# Patient Record
Sex: Female | Born: 1935 | Race: White | Hispanic: No | Smoking: Never smoker
Health system: Southern US, Community
[De-identification: ages and names within clinical notes are randomized; demographics above are authoritative.]

## PROBLEM LIST (undated history)

## (undated) DIAGNOSIS — I10 Essential (primary) hypertension: Secondary | ICD-10-CM

## (undated) DIAGNOSIS — K5790 Diverticulosis of intestine, part unspecified, without perforation or abscess without bleeding: Secondary | ICD-10-CM

## (undated) HISTORY — PX: TONSILLECTOMY: SUR1361

---

## 1999-07-10 ENCOUNTER — Other Ambulatory Visit: Admission: RE | Admit: 1999-07-10 | Discharge: 1999-07-10 | Payer: Self-pay | Admitting: Family Medicine

## 2000-07-14 ENCOUNTER — Other Ambulatory Visit: Admission: RE | Admit: 2000-07-14 | Discharge: 2000-07-14 | Payer: Self-pay | Admitting: Family Medicine

## 2000-08-26 ENCOUNTER — Encounter: Admission: RE | Admit: 2000-08-26 | Discharge: 2000-08-26 | Payer: Self-pay | Admitting: Family Medicine

## 2000-08-26 ENCOUNTER — Encounter: Payer: Self-pay | Admitting: Family Medicine

## 2001-07-21 ENCOUNTER — Other Ambulatory Visit: Admission: RE | Admit: 2001-07-21 | Discharge: 2001-07-21 | Payer: Self-pay | Admitting: Family Medicine

## 2001-08-27 ENCOUNTER — Encounter: Admission: RE | Admit: 2001-08-27 | Discharge: 2001-08-27 | Payer: Self-pay | Admitting: Family Medicine

## 2001-08-27 ENCOUNTER — Encounter: Payer: Self-pay | Admitting: Family Medicine

## 2002-08-26 ENCOUNTER — Other Ambulatory Visit: Admission: RE | Admit: 2002-08-26 | Discharge: 2002-08-26 | Payer: Self-pay | Admitting: Family Medicine

## 2002-08-30 ENCOUNTER — Encounter: Payer: Self-pay | Admitting: Family Medicine

## 2002-08-30 ENCOUNTER — Encounter: Admission: RE | Admit: 2002-08-30 | Discharge: 2002-08-30 | Payer: Self-pay | Admitting: Family Medicine

## 2003-09-01 ENCOUNTER — Encounter: Admission: RE | Admit: 2003-09-01 | Discharge: 2003-09-01 | Payer: Self-pay | Admitting: Family Medicine

## 2003-09-01 ENCOUNTER — Encounter: Payer: Self-pay | Admitting: Family Medicine

## 2003-09-08 ENCOUNTER — Other Ambulatory Visit: Admission: RE | Admit: 2003-09-08 | Discharge: 2003-09-08 | Payer: Self-pay | Admitting: Family Medicine

## 2004-09-13 ENCOUNTER — Encounter: Admission: RE | Admit: 2004-09-13 | Discharge: 2004-09-13 | Payer: Self-pay | Admitting: Family Medicine

## 2004-09-21 ENCOUNTER — Other Ambulatory Visit: Admission: RE | Admit: 2004-09-21 | Discharge: 2004-09-21 | Payer: Self-pay | Admitting: Family Medicine

## 2005-10-17 ENCOUNTER — Encounter: Admission: RE | Admit: 2005-10-17 | Discharge: 2005-10-17 | Payer: Self-pay | Admitting: Family Medicine

## 2006-10-21 ENCOUNTER — Encounter: Admission: RE | Admit: 2006-10-21 | Discharge: 2006-10-21 | Payer: Self-pay | Admitting: Family Medicine

## 2007-10-27 ENCOUNTER — Encounter: Admission: RE | Admit: 2007-10-27 | Discharge: 2007-10-27 | Payer: Self-pay | Admitting: Family Medicine

## 2007-11-03 ENCOUNTER — Other Ambulatory Visit: Admission: RE | Admit: 2007-11-03 | Discharge: 2007-11-03 | Payer: Self-pay | Admitting: Family Medicine

## 2008-10-28 ENCOUNTER — Encounter: Admission: RE | Admit: 2008-10-28 | Discharge: 2008-10-28 | Payer: Self-pay | Admitting: Family Medicine

## 2008-11-15 ENCOUNTER — Ambulatory Visit (HOSPITAL_COMMUNITY): Admission: RE | Admit: 2008-11-15 | Discharge: 2008-11-15 | Payer: Self-pay | Admitting: Family Medicine

## 2008-11-15 ENCOUNTER — Ambulatory Visit: Payer: Self-pay | Admitting: Vascular Surgery

## 2008-11-15 ENCOUNTER — Encounter (INDEPENDENT_AMBULATORY_CARE_PROVIDER_SITE_OTHER): Payer: Self-pay | Admitting: Family Medicine

## 2009-10-30 ENCOUNTER — Encounter: Admission: RE | Admit: 2009-10-30 | Discharge: 2009-10-30 | Payer: Self-pay | Admitting: Family Medicine

## 2010-11-06 ENCOUNTER — Encounter: Admission: RE | Admit: 2010-11-06 | Discharge: 2010-11-06 | Payer: Self-pay | Admitting: Family Medicine

## 2010-11-20 ENCOUNTER — Other Ambulatory Visit
Admission: RE | Admit: 2010-11-20 | Discharge: 2010-11-20 | Payer: Self-pay | Source: Home / Self Care | Admitting: Family Medicine

## 2011-10-13 ENCOUNTER — Emergency Department (HOSPITAL_COMMUNITY): Payer: BC Managed Care – PPO

## 2011-10-13 ENCOUNTER — Inpatient Hospital Stay (HOSPITAL_COMMUNITY)
Admission: EM | Admit: 2011-10-13 | Discharge: 2011-10-16 | DRG: 182 | Disposition: A | Discharge status: Home / Self Care | Payer: BC Managed Care – PPO | Attending: Internal Medicine | Admitting: Internal Medicine

## 2011-10-13 ENCOUNTER — Encounter: Payer: Self-pay | Admitting: Emergency Medicine

## 2011-10-13 DIAGNOSIS — I10 Essential (primary) hypertension: Secondary | ICD-10-CM | POA: Diagnosis present

## 2011-10-13 DIAGNOSIS — R197 Diarrhea, unspecified: Secondary | ICD-10-CM

## 2011-10-13 DIAGNOSIS — K621 Rectal polyp: Secondary | ICD-10-CM | POA: Clinically undetermined

## 2011-10-13 DIAGNOSIS — I959 Hypotension, unspecified: Secondary | ICD-10-CM | POA: Diagnosis present

## 2011-10-13 DIAGNOSIS — K625 Hemorrhage of anus and rectum: Secondary | ICD-10-CM

## 2011-10-13 DIAGNOSIS — D128 Benign neoplasm of rectum: Secondary | ICD-10-CM | POA: Diagnosis present

## 2011-10-13 DIAGNOSIS — N9489 Other specified conditions associated with female genital organs and menstrual cycle: Secondary | ICD-10-CM | POA: Diagnosis present

## 2011-10-13 DIAGNOSIS — E876 Hypokalemia: Secondary | ICD-10-CM | POA: Diagnosis present

## 2011-10-13 DIAGNOSIS — A0472 Enterocolitis due to Clostridium difficile, not specified as recurrent: Secondary | ICD-10-CM

## 2011-10-13 DIAGNOSIS — R1031 Right lower quadrant pain: Secondary | ICD-10-CM | POA: Diagnosis present

## 2011-10-13 DIAGNOSIS — K55039 Acute (reversible) ischemia of large intestine, extent unspecified: Secondary | ICD-10-CM | POA: Diagnosis present

## 2011-10-13 DIAGNOSIS — D72829 Elevated white blood cell count, unspecified: Secondary | ICD-10-CM | POA: Diagnosis present

## 2011-10-13 DIAGNOSIS — D129 Benign neoplasm of anus and anal canal: Secondary | ICD-10-CM | POA: Diagnosis present

## 2011-10-13 DIAGNOSIS — K5730 Diverticulosis of large intestine without perforation or abscess without bleeding: Secondary | ICD-10-CM | POA: Diagnosis present

## 2011-10-13 HISTORY — DX: Diverticulosis of intestine, part unspecified, without perforation or abscess without bleeding: K57.90

## 2011-10-13 HISTORY — DX: Essential (primary) hypertension: I10

## 2011-10-13 LAB — PROTIME-INR
INR: 0.96 (ref 0.00–1.49)
Prothrombin Time: 13 seconds (ref 11.6–15.2)

## 2011-10-13 LAB — BASIC METABOLIC PANEL
BUN: 17 mg/dL (ref 6–23)
CO2: 25 mEq/L (ref 19–32)
Chloride: 99 mEq/L (ref 96–112)
Creatinine, Ser: 0.72 mg/dL (ref 0.50–1.10)

## 2011-10-13 LAB — CBC
HCT: 39.5 % (ref 36.0–46.0)
Hemoglobin: 14.1 g/dL (ref 12.0–15.0)
MCHC: 35.7 g/dL (ref 30.0–36.0)
WBC: 18.8 10*3/uL — ABNORMAL HIGH (ref 4.0–10.5)

## 2011-10-13 LAB — OCCULT BLOOD, POC DEVICE: Fecal Occult Bld: POSITIVE

## 2011-10-13 LAB — DIFFERENTIAL
Lymphocytes Relative: 5 % — ABNORMAL LOW (ref 12–46)
Monocytes Absolute: 1.2 10*3/uL — ABNORMAL HIGH (ref 0.1–1.0)
Monocytes Relative: 7 % (ref 3–12)
Neutro Abs: 16.6 10*3/uL — ABNORMAL HIGH (ref 1.7–7.7)

## 2011-10-13 MED ORDER — PANTOPRAZOLE SODIUM 40 MG IV SOLR
INTRAVENOUS | Status: AC
  Filled 2011-10-13: qty 40

## 2011-10-13 MED ORDER — MORPHINE SULFATE 4 MG/ML IJ SOLN
4.0000 mg | Freq: Once | INTRAMUSCULAR | Status: AC
  Administered 2011-10-13: 2 mg via INTRAVENOUS

## 2011-10-13 MED ORDER — IOHEXOL 300 MG/ML  SOLN
100.0000 mL | Freq: Once | INTRAMUSCULAR | Status: AC | PRN
  Administered 2011-10-13: 100 mL via INTRAVENOUS

## 2011-10-13 MED ORDER — MORPHINE SULFATE 4 MG/ML IJ SOLN
INTRAMUSCULAR | Status: AC
  Filled 2011-10-13: qty 1

## 2011-10-13 MED ORDER — PANTOPRAZOLE SODIUM 40 MG IV SOLR
40.0000 mg | Freq: Once | INTRAVENOUS | Status: AC
  Administered 2011-10-13: 40 mg via INTRAVENOUS

## 2011-10-13 NOTE — ED Notes (Signed)
Family at bedside. 

## 2011-10-13 NOTE — ED Notes (Signed)
ZOX:[id number]<AV> Expected date:10/13/11<BR> Expected time: 4:19 PM<BR> Means of arrival:Ambulance<BR> Comments:<BR> [facility] transporting 76 yoF Lightheaded, cramps.  Initially hypotensive.

## 2011-10-13 NOTE — ED Notes (Signed)
Patient denies pain and is resting comfortably. Updated on care

## 2011-10-14 ENCOUNTER — Encounter (HOSPITAL_COMMUNITY): Payer: Self-pay | Admitting: Internal Medicine

## 2011-10-14 LAB — CBC
HCT: 36.8 % (ref 36.0–46.0)
MCH: 32.2 pg (ref 26.0–34.0)
MCHC: 35.6 g/dL (ref 30.0–36.0)
MCV: 90.4 fL (ref 78.0–100.0)
RDW: 11.9 % (ref 11.5–15.5)

## 2011-10-14 MED ORDER — ACETAMINOPHEN 325 MG PO TABS
650.0000 mg | ORAL_TABLET | Freq: Four times a day (QID) | ORAL | Status: DC | PRN
  Filled 2011-10-14 (×2): qty 2

## 2011-10-14 MED ORDER — ZOLPIDEM TARTRATE 5 MG PO TABS
5.0000 mg | ORAL_TABLET | Freq: Every evening | ORAL | Status: DC | PRN
  Administered 2011-10-14 – 2011-10-15 (×2): 5 mg via ORAL
  Filled 2011-10-14 (×2): qty 1

## 2011-10-14 MED ORDER — AMLODIPINE BESYLATE 10 MG PO TABS
10.0000 mg | ORAL_TABLET | Freq: Every day | ORAL | Status: DC
  Administered 2011-10-14 – 2011-10-16 (×3): 10 mg via ORAL
  Filled 2011-10-14 (×3): qty 1

## 2011-10-14 MED ORDER — ONDANSETRON HCL 4 MG PO TABS
4.0000 mg | ORAL_TABLET | Freq: Four times a day (QID) | ORAL | Status: DC | PRN
  Filled 2011-10-14: qty 1

## 2011-10-14 MED ORDER — METRONIDAZOLE IN NACL 5-0.79 MG/ML-% IV SOLN
500.0000 mg | Freq: Three times a day (TID) | INTRAVENOUS | Status: DC
  Administered 2011-10-14: 500 mg via INTRAVENOUS
  Filled 2011-10-14 (×4): qty 100

## 2011-10-14 MED ORDER — HYDROMORPHONE HCL PF 1 MG/ML IJ SOLN: 0.5000 mg | INTRAMUSCULAR | Status: DC | PRN

## 2011-10-14 MED ORDER — OXYCODONE HCL 5 MG PO TABS
5.0000 mg | ORAL_TABLET | ORAL | Status: DC | PRN
  Filled 2011-10-14: qty 1

## 2011-10-14 MED ORDER — METRONIDAZOLE 500 MG PO TABS
500.0000 mg | ORAL_TABLET | Freq: Three times a day (TID) | ORAL | Status: DC
  Administered 2011-10-14: 500 mg via ORAL
  Filled 2011-10-14 (×3): qty 1

## 2011-10-14 MED ORDER — CIPROFLOXACIN IN D5W 400 MG/200ML IV SOLN
400.0000 mg | Freq: Two times a day (BID) | INTRAVENOUS | Status: DC
  Administered 2011-10-14: 400 mg via INTRAVENOUS
  Filled 2011-10-14 (×3): qty 200

## 2011-10-14 MED ORDER — SODIUM CHLORIDE 0.9 % IV SOLN
INTRAVENOUS | Status: DC
  Administered 2011-10-14 – 2011-10-15 (×2): via INTRAVENOUS

## 2011-10-14 MED ORDER — LISINOPRIL-HYDROCHLOROTHIAZIDE 20-12.5 MG PO TABS: 2.0000 | ORAL_TABLET | Freq: Every day | ORAL | Status: DC

## 2011-10-14 MED ORDER — SENNA 8.6 MG PO TABS
2.0000 | ORAL_TABLET | Freq: Every day | ORAL | Status: DC | PRN
  Filled 2011-10-14: qty 2

## 2011-10-14 MED ORDER — LISINOPRIL 40 MG PO TABS
40.0000 mg | ORAL_TABLET | Freq: Every day | ORAL | Status: DC
  Administered 2011-10-14 – 2011-10-16 (×3): 40 mg via ORAL
  Filled 2011-10-14 (×3): qty 1

## 2011-10-14 MED ORDER — METRONIDAZOLE 500 MG PO TABS
500.0000 mg | ORAL_TABLET | Freq: Three times a day (TID) | ORAL | Status: DC
  Administered 2011-10-14 – 2011-10-16 (×5): 500 mg via ORAL
  Filled 2011-10-14 (×9): qty 1

## 2011-10-14 MED ORDER — HYDROCHLOROTHIAZIDE 25 MG PO TABS
25.0000 mg | ORAL_TABLET | Freq: Every day | ORAL | Status: DC
  Administered 2011-10-14 – 2011-10-15 (×2): 25 mg via ORAL
  Filled 2011-10-14 (×3): qty 1

## 2011-10-14 MED ORDER — ALUM & MAG HYDROXIDE-SIMETH 200-200-20 MG/5ML PO SUSP
30.0000 mL | Freq: Four times a day (QID) | ORAL | Status: DC | PRN
  Filled 2011-10-14: qty 30

## 2011-10-14 MED ORDER — ACETAMINOPHEN 650 MG RE SUPP
650.0000 mg | Freq: Four times a day (QID) | RECTAL | Status: DC | PRN
  Filled 2011-10-14: qty 1

## 2011-10-14 MED ORDER — ONDANSETRON HCL 4 MG/2ML IJ SOLN: 4.0000 mg | Freq: Three times a day (TID) | INTRAMUSCULAR | Status: AC | PRN

## 2011-10-14 MED ORDER — SODIUM CHLORIDE 0.9 % IV SOLN: INTRAVENOUS | Status: DC

## 2011-10-14 MED ORDER — SACCHAROMYCES BOULARDII 250 MG PO CAPS
250.0000 mg | ORAL_CAPSULE | Freq: Two times a day (BID) | ORAL | Status: DC
  Administered 2011-10-14 – 2011-10-16 (×5): 250 mg via ORAL
  Filled 2011-10-14 (×6): qty 1

## 2011-10-14 NOTE — H&P (Signed)
Admission Date:  10/14/2011  PCP:   Dr. Kathrene Bongo  Chief Complaint:    HPI: Joan Cook is an 75 y.o. female who presents with sudden onset of RLQ ABD Pain followed by rectal bleeding with bright red blood in the afternoon while she was playing tennis.  She states she felt dizzy and almost fainted, EMS was called and her blood pressure was found to be 70's systolic, IV Fluids were started in the field and her blood pressure improved.  Her friends on the tennis court gave her 2 baby aspirin prior to EMS arriving.  She denied having any chest pain, nausea or vomiting or fevers or chills.   She had a similar episode a few years ago.    Past Medical History  Diagnosis Date  . Hypertension   . Diverticulosis     Past Surgical History  Procedure Date  . Tonsillectomy     Medications:  HOME MEDS: Prior to Admission medications   Medication Sig Start Date End Date Taking? Authorizing Provider  amLODipine (NORVASC) 10 MG tablet Take 10 mg by mouth daily.    Yes Historical Provider, MD  lisinopril-hydrochlorothiazide (PRINZIDE,ZESTORETIC) 20-12.5 MG per tablet Take 2 tablets by mouth daily.    Yes Historical Provider, MD    Allergies:  No Known Allergies  Social History:   reports that she has never smoked. She does not have any smokeless tobacco history on file. She reports that she drinks alcohol. She reports that she does not use illicit drugs.  Family History: Family History  Problem Relation Age of Onset  . Hypertension Mother   . Hypertension Father     Rewiew of Systems:  The patient denies anorexia, fever, weight loss,, vision loss, decreased hearing, hoarseness, chest pain, syncope, dyspnea on exertion, peripheral edema, balance deficits, hemoptysis,severe indigestion/heartburn, hematuria, incontinence, genital sores, muscle weakness, suspicious skin lesions, transient blindness, difficulty walking, depression, unusual weight change, enlarged lymph nodes, angioedema, and  breast masses.  Physical Exam: Filed Vitals:   10/13/11 1630 10/13/11 1800 10/13/11 2017 10/14/11 0132  BP: 150/53 124/50 122/53 133/62  Pulse: 87  65 78  Temp: 97.7 F (36.5 C)   98.5 F (36.9 C)  TempSrc: Oral  Other (Comment) Oral  Resp: 18   18  SpO2: 99%  99% 100%   Blood pressure 133/62, pulse 78, temperature 98.5 F (36.9 C), temperature source Oral, resp. rate 18, SpO2 100.00%.  GEN:  Pleasant 75 year old thin well developed female lying in the stretcher in no acute distress; cooperative with exam PSYCH: She is alert and oriented x4; does not appear anxious does not appear depressed; affect is normal HEENT: Normocephalic and Atraumatic, Mucous membranes pink and anicteric; PERRLA; EOM intact; Nares: Patent, Oropharynx: Clear, Edentulous or Fair Dentition, Neck:  FROM, no cervical lymphadenopathy nor thyromegaly or carotid bruit; no JVD; Breasts:: Not examined CHEST WALL: No tenderness CHEST: Normal respiration, clear to auscultation bilaterally HEART: Regular rate and rhythm; no murmurs rubs or gallops BACK: No kyphosis or scoliosis; no CVA tenderness ABDOMEN: Obese, soft non-tender; no masses, no organomegaly, normal abdominal bowel sounds; no pannus; no intertriginous candida. Rectal Exam: Done by EDP FOBT: Bright Red Blood, Strongly Heme+++  EXTREMITIES: No bone or joint deformity; age-appropriate arthropathy of the hands and knees; no edema; no ulcerations. Genitalia: not examined PULSES: 2+ and symmetric SKIN: Normal hydration no rash or ulceration CNS: Cranial nerves 2-12 grossly intact no focal neurologic deficit   Labs & Imaging Results for orders placed during  the hospital encounter of 10/13/11 (from the past 48 hour(s))  CBC     Status: Abnormal   Collection Time   10/13/11  6:00 PM      Component Value Range Comment   WBC 18.8 (*) 4.0 - 10.5 (K/uL)    RBC 4.37  3.87 - 5.11 (MIL/uL)    Hemoglobin 14.1  12.0 - 15.0 (g/dL)    HCT 04.5  40.9 - 81.1 (%)    MCV  90.4  78.0 - 100.0 (fL)    MCH 32.3  26.0 - 34.0 (pg)    MCHC 35.7  30.0 - 36.0 (g/dL)    RDW 91.4  78.2 - 95.6 (%)    Platelets 278  150 - 400 (K/uL)   DIFFERENTIAL     Status: Abnormal   Collection Time   10/13/11  6:00 PM      Component Value Range Comment   Neutrophils Relative 88 (*) 43 - 77 (%)    Neutro Abs 16.6 (*) 1.7 - 7.7 (K/uL)    Lymphocytes Relative 5 (*) 12 - 46 (%)    Lymphs Abs 0.9  0.7 - 4.0 (K/uL)    Monocytes Relative 7  3 - 12 (%)    Monocytes Absolute 1.2 (*) 0.1 - 1.0 (K/uL)    Eosinophils Relative 0  0 - 5 (%)    Eosinophils Absolute 0.0  0.0 - 0.7 (K/uL)    Basophils Relative 0  0 - 1 (%)    Basophils Absolute 0.0  0.0 - 0.1 (K/uL)   BASIC METABOLIC PANEL     Status: Abnormal   Collection Time   10/13/11  6:00 PM      Component Value Range Comment   Sodium 135  135 - 145 (mEq/L)    Potassium 4.0  3.5 - 5.1 (mEq/L)    Chloride 99  96 - 112 (mEq/L)    CO2 25  19 - 32 (mEq/L)    Glucose, Bld 176 (*) 70 - 99 (mg/dL)    BUN 17  6 - 23 (mg/dL)    Creatinine, Ser 2.13  0.50 - 1.10 (mg/dL)    Calcium 9.7  8.4 - 10.5 (mg/dL)    GFR calc non Af Amer 83 (*) >90 (mL/min)    GFR calc Af Amer >90  >90 (mL/min)   PROTIME-INR     Status: Normal   Collection Time   10/13/11  6:00 PM      Component Value Range Comment   Prothrombin Time 13.0  11.6 - 15.2 (seconds)    INR 0.96  0.00 - 1.49    APTT     Status: Normal   Collection Time   10/13/11  6:00 PM      Component Value Range Comment   aPTT 27  24 - 37 (seconds)   OCCULT BLOOD, POC DEVICE     Status: Normal   Collection Time   10/13/11  6:14 PM      Component Value Range Comment   Fecal Occult Bld POSITIVE     TYPE AND SCREEN     Status: Normal   Collection Time   10/13/11  6:41 PM      Component Value Range Comment   ABO/RH(D) A POS      Antibody Screen NEG      Sample Expiration 10/16/2011     ABO/RH     Status: Normal   Collection Time   10/13/11  7:22 PM      Component  Value Range Comment   ABO/RH(D)  A POS      Ct Abdomen Pelvis W Contrast  10/13/2011  *RADIOLOGY REPORT*  Clinical Data: Diarrhea, abdominal pain.  CT ABDOMEN AND PELVIS WITH CONTRAST  Technique:  Multidetector CT imaging of the abdomen and pelvis was performed following the standard protocol during bolus administration of intravenous contrast.  Contrast: OMNIPAQUE IOHEXOL 300 MG/ML IV SOLN  Comparison:  None  Findings: Limited images through the lung bases demonstrate no significant appreciable abnormality. The heart size is within normal limits. No pleural or pericardial effusion.  Several hepatic hypodensities are nonspecific however nonaggressive appearing.  No biliary ductal dilatation.  Unremarkable spleen, pancreas, adrenal glands.  There are a couple tiny hypodensities within the lower poles of the kidneys. No hydronephrosis.  No hydroureter.  Colonic diverticulosis.  No overt evidence for diverticulitis.  No bowel obstruction.  Normal appendix.  No free intraperitoneal air or fluid.  No lymphadenopathy.  There is scattered atherosclerotic calcification of the aorta and its branches. No aneurysmal dilatation.  Thin-walled bladder.  Unremarkable uterus.  Right adnexal lesion with punctate calcifications may reflect an exophytic fibroid or ovarian lesion in can be followed up with a non emergent ultrasound.  No acute osseous abnormality.  L4-5 DDD.  IMPRESSION: Colonic diverticulosis without overt evidence for diverticulitis.  2.7 cm right adnexal lesion with associated punctate calcifications may represent an exophytic fibroid or ovarian process.  Consider non emergent ultrasound follow-up.  Original Report Authenticated By: Waneta Martins, M.D.      Assessment: 1.  Rectal Bleeding 2.  Diverticulosis with Diverticular Bleeding versus Possible Early Diverticitis 3.  Hypertension 4.  Leukocytosis  Plan:  Admitted, and H/Hs will be performed q 8 hours X 48 hours, a type and Screen has been ordered in the event Blood  needs to be transfused.  Patient will be placed on IV Cipro and Flagyl Antibiotic therapy, Stool Studies have been ordered for Culture and Sensitivity and C.Diff, however this is felt to be due to a probable Diverticular bleed.  GI will be consulted for further evaluation. And the patient will be placed on clear liquids and IVFs have been ordered for maintenance therapy.  SCDs have been ordered for DVT prophylaxis.  The patient is a Full Code at this time.   Other plans as per orders.   Courtany Mcmurphy C 10/14/2011, 3:07 AM

## 2011-10-14 NOTE — Consult Note (Signed)
[facility] Consult Note  Referring Provider: No ref. provider found Primary Care Physician:  No primary provider on file. Primary Gastroenterologist:  Dr.  Antony Contras Complaint: Abdominal cramps and bloody diarrhea HPI: Joan Cook is an 75 y.o. white female who presents sudden onset yesterday while playing tennis of generalized abdominal cramps, malaise sweatiness and incontinence of diarrhea. The diarrhea continued and became voluminous in accompanied by increasing amounts of blood. She was taken to the emergency room. Her initial blood pressure on the scene was 90/40. She was afebrile but was found to have a white blood cell count of 18,800. She underwent an abdominal CT scan which revealed left-sided diverticulosis but no other definite abnormalities. She underwent a colonoscopy in 2010 which showed some small adenomatous polyps and left-sided diverticulosis. Her abdominal cramps are moderately improved and her diarrhea has slowed. She was started on Cipro and Flagyl and her white blood cell count today is 13,000.  She denies any recent antibiotics although she has changed to close of her husband who lives in a nursing home and the last week. However no 1 at the nursing home has been reportedly ill. She denies any recent travel.  Past Medical History  Diagnosis Date  . Hypertension   . Diverticulosis     Past Surgical History  Procedure Date  . Tonsillectomy     Medications Prior to Admission  Medication Dose Route Frequency Provider Last Rate Last Dose  . 0.9 %  sodium chloride infusion   Intravenous Continuous Ron Parker, MD      . acetaminophen (TYLENOL) tablet 650 mg  650 mg Oral Q6H PRN Ron Parker, MD       Or  . acetaminophen (TYLENOL) suppository 650 mg  650 mg Rectal Q6H PRN Ron Parker, MD      . alum & mag hydroxide-simeth (MAALOX/MYLANTA) 200-200-20 MG/5ML suspension 30 mL  30 mL Oral Q6H PRN Ron Parker, MD      . amLODipine  (NORVASC) tablet 10 mg  10 mg Oral Daily Ron Parker, MD   10 mg at 10/14/11 0945  . ciprofloxacin (CIPRO) IVPB 400 mg  400 mg Intravenous Q12H Ron Parker, MD   400 mg at 10/14/11 0558  . lisinopril (PRINIVIL,ZESTRIL) tablet 40 mg  40 mg Oral Daily Elvira Chika Madueme, PHARMD   40 mg at 10/14/11 0946   And  . hydrochlorothiazide (HYDRODIURIL) tablet 25 mg  25 mg Oral Daily Elvira Chika Madueme, PHARMD   25 mg at 10/14/11 0946  . HYDROmorphone (DILAUDID) injection 0.5-1 mg  0.5-1 mg Intravenous Q3H PRN Ron Parker, MD      . iohexol (OMNIPAQUE) 300 MG/ML injection 100 mL  100 mL Intravenous Once PRN Medication Radiologist   100 mL at 10/13/11 2310  . metroNIDAZOLE (FLAGYL) IVPB 500 mg  500 mg Intravenous Q8H Ron Parker, MD   500 mg at 10/14/11 9604  . metroNIDAZOLE (FLAGYL) tablet 500 mg  500 mg Oral Q8H Adeline C Viyuoh   500 mg at 10/14/11 1400  . morphine 4 MG/ML injection 4 mg  4 mg Intravenous Once Nat Christen, MD   2 mg at 10/13/11 1850  . ondansetron (ZOFRAN) injection 4 mg  4 mg Intravenous Q8H PRN Nat Christen, MD      . ondansetron [facility]) tablet 4 mg  4 mg Oral Q6H PRN Ron Parker, MD      . oxyCODONE (Oxy IR/ROXICODONE) immediate release tablet  5 mg  5 mg Oral Q4H PRN Ron Parker, MD      . pantoprazole (PROTONIX) injection 40 mg  40 mg Intravenous Once Nat Christen, MD   40 mg at 10/13/11 1850  . saccharomyces boulardii (FLORASTOR) capsule 250 mg  250 mg Oral BID Adeline C Viyuoh   250 mg at 10/14/11 1400  . senna (SENOKOT) tablet 17.2 mg  2 tablet Oral Daily PRN Ron Parker, MD      . zolpidem (AMBIEN) tablet 5 mg  5 mg Oral QHS PRN Ron Parker, MD      . DISCONTD: 0.9 %  sodium chloride infusion   Intravenous STAT Nat Christen, MD      . DISCONTD: lisinopril-hydrochlorothiazide (PRINZIDE,ZESTORETIC) 20-12.5 MG per tablet 2 tablet  2 tablet Oral Daily Ron Parker, MD       No current outpatient  prescriptions on file as of 10/14/2011.    Allergies: No Known Allergies  Family History  Problem Relation Age of Onset  . Hypertension Mother   . Hypertension Father     Social History:  reports that she has never smoked. She does not have any smokeless tobacco history on file. She reports that she drinks alcohol. She reports that she does not use illicit drugs.    Blood pressure 151/70, pulse 77, temperature 98.4 F (36.9 C), temperature source Oral, resp. rate 16, weight 70.2 kg (154 lb 12.2 oz), SpO2 98.00%. Head: Normocephalic, without obvious abnormality, atraumatic Neck: no adenopathy, no carotid bruit, no JVD, supple, symmetrical, trachea midline and thyroid not enlarged, symmetric, no tenderness/mass/nodules Resp: clear to auscultation bilaterally Cardio: regular rate and rhythm, S1, S2 normal, no murmur, click, rub or gallop GI:  abdomen soft, nondistended with normoactive bowel sounds. No hepatosplenomegaly, mass or guarding.  Extremities: extremities normal, atraumatic, no cyanosis or edema  Results for orders placed during the hospital encounter of 10/13/11 (from the past 48 hour(s))  CBC     Status: Abnormal   Collection Time   10/13/11  6:00 PM      Component Value Range Comment   WBC 18.8 (*) 4.0 - 10.5 (K/uL)    RBC 4.37  3.87 - 5.11 (MIL/uL)    Hemoglobin 14.1  12.0 - 15.0 (g/dL)    HCT 16.1  09.6 - 04.5 (%)    MCV 90.4  78.0 - 100.0 (fL)    MCH 32.3  26.0 - 34.0 (pg)    MCHC 35.7  30.0 - 36.0 (g/dL)    RDW 40.9  81.1 - 91.4 (%)    Platelets 278  150 - 400 (K/uL)   DIFFERENTIAL     Status: Abnormal   Collection Time   10/13/11  6:00 PM      Component Value Range Comment   Neutrophils Relative 88 (*) 43 - 77 (%)    Neutro Abs 16.6 (*) 1.7 - 7.7 (K/uL)    Lymphocytes Relative 5 (*) 12 - 46 (%)    Lymphs Abs 0.9  0.7 - 4.0 (K/uL)    Monocytes Relative 7  3 - 12 (%)    Monocytes Absolute 1.2 (*) 0.1 - 1.0 (K/uL)    Eosinophils Relative 0  0 - 5 (%)     Eosinophils Absolute 0.0  0.0 - 0.7 (K/uL)    Basophils Relative 0  0 - 1 (%)    Basophils Absolute 0.0  0.0 - 0.1 (K/uL)   BASIC METABOLIC PANEL     Status: Abnormal  Collection Time   10/13/11  6:00 PM      Component Value Range Comment   Sodium 135  135 - 145 (mEq/L)    Potassium 4.0  3.5 - 5.1 (mEq/L)    Chloride 99  96 - 112 (mEq/L)    CO2 25  19 - 32 (mEq/L)    Glucose, Bld 176 (*) 70 - 99 (mg/dL)    BUN 17  6 - 23 (mg/dL)    Creatinine, Ser 4.09  0.50 - 1.10 (mg/dL)    Calcium 9.7  8.4 - 10.5 (mg/dL)    GFR calc non Af Amer 83 (*) >90 (mL/min)    GFR calc Af Amer >90  >90 (mL/min)   PROTIME-INR     Status: Normal   Collection Time   10/13/11  6:00 PM      Component Value Range Comment   Prothrombin Time 13.0  11.6 - 15.2 (seconds)    INR 0.96  0.00 - 1.49    APTT     Status: Normal   Collection Time   10/13/11  6:00 PM      Component Value Range Comment   aPTT 27  24 - 37 (seconds)   OCCULT BLOOD, POC DEVICE     Status: Normal   Collection Time   10/13/11  6:14 PM      Component Value Range Comment   Fecal Occult Bld POSITIVE     STOOL CULTURE     Status: Normal (Preliminary result)   Collection Time   10/13/11  6:20 PM      Component Value Range Comment   Specimen Description STOOL      Special Requests NONE      Culture Culture reincubated for better growth      Report Status PENDING     TYPE AND SCREEN     Status: Normal   Collection Time   10/13/11  6:41 PM      Component Value Range Comment   ABO/RH(D) A POS      Antibody Screen NEG      Sample Expiration 10/16/2011     ABO/RH     Status: Normal   Collection Time   10/13/11  7:22 PM      Component Value Range Comment   ABO/RH(D) A POS     CBC     Status: Abnormal   Collection Time   10/14/11  4:38 AM      Component Value Range Comment   WBC 13.6 (*) 4.0 - 10.5 (K/uL)    RBC 4.07  3.87 - 5.11 (MIL/uL)    Hemoglobin 13.1  12.0 - 15.0 (g/dL)    HCT 81.1  91.4 - 78.2 (%)    MCV 90.4  78.0 - 100.0 (fL)     MCH 32.2  26.0 - 34.0 (pg)    MCHC 35.6  30.0 - 36.0 (g/dL)    RDW 95.6  21.3 - 08.6 (%)    Platelets 244  150 - 400 (K/uL)   CLOSTRIDIUM DIFFICILE BY PCR     Status: Abnormal   Collection Time   10/14/11  9:58 AM      Component Value Range Comment   C difficile by pcr POSITIVE (*) NEGATIVE     Ct Abdomen Pelvis W Contrast  10/13/2011  *RADIOLOGY REPORT*  Clinical Data: Diarrhea, abdominal pain.  CT ABDOMEN AND PELVIS WITH CONTRAST  Technique:  Multidetector CT imaging of the abdomen and pelvis was performed following the standard protocol  during bolus administration of intravenous contrast.  Contrast: OMNIPAQUE IOHEXOL 300 MG/ML IV SOLN  Comparison:  None  Findings: Limited images through the lung bases demonstrate no significant appreciable abnormality. The heart size is within normal limits. No pleural or pericardial effusion.  Several hepatic hypodensities are nonspecific however nonaggressive appearing.  No biliary ductal dilatation.  Unremarkable spleen, pancreas, adrenal glands.  There are a couple tiny hypodensities within the lower poles of the kidneys. No hydronephrosis.  No hydroureter.  Colonic diverticulosis.  No overt evidence for diverticulitis.  No bowel obstruction.  Normal appendix.  No free intraperitoneal air or fluid.  No lymphadenopathy.  There is scattered atherosclerotic calcification of the aorta and its branches. No aneurysmal dilatation.  Thin-walled bladder.  Unremarkable uterus.  Right adnexal lesion with punctate calcifications may reflect an exophytic fibroid or ovarian lesion in can be followed up with a non emergent ultrasound.  No acute osseous abnormality.  L4-5 DDD.  IMPRESSION: Colonic diverticulosis without overt evidence for diverticulitis.  2.7 cm right adnexal lesion with associated punctate calcifications may represent an exophytic fibroid or ovarian process.  Consider non emergent ultrasound follow-up.  Original Report Authenticated By: Waneta Martins,  M.D.    Assessment: Acute bloody diarrhea with positive C. difficile toxin somewhat atypical presentation for pseudomembranous colitis. Plan:  Any Flagyl and would continue Cipro until any pending stool studies have returned. Would consider colonoscopy or sigmoidoscopy if does not improve to baseline promptly. Burwell Bethel C 10/14/2011, 3:39 PM

## 2011-10-14 NOTE — Progress Notes (Signed)
Pt came in from ER via stretcher,alert and oriented x3, vital signs b/p 150/53, p 87, T 98.5,. Pt had an episode of bright red  Watery stool upon arrival to the floor, she reports she has been having bloody stool, since the evening of the 10/13/11. Pt denies pain at this time, will continue to monitor,Kenzo Ozment Amy 10/14/11.

## 2011-10-14 NOTE — Progress Notes (Signed)
I have seen and examined pt , she states she has continued  To have bloody diarrhea overnight. She is followed by Dr Bosie Clos. Will obtain stool for c.diff, continue abx and consult GI for further recs. Will continue current management plan as per  Dr  Lovell Sheehan, Follow pending studies and further treat as appropriate.

## 2011-10-14 NOTE — ED Provider Notes (Addendum)
History     CSN: [id number] Arrival date & time: 10/13/2011  4:24 PM   First MD Initiated Contact with Patient 10/13/11 1753      Chief Complaint  Patient presents with  . Abdominal Pain    followed by diaphoresis and stool incontinence  . Dizziness    (Consider location/radiation/quality/duration/timing/severity/associated sxs/prior treatment) HPI Comments: Patient presented with some diarrhea that began this afternoon.  She also noted some vague generalized abdominal pain.  She's had no associated nausea, vomiting, fevers.  She has noted that his she's continued to have frequent stools they do appear to be reddish in color it and possibly bloody.  She has not had any past GI bleeds.  No prior abdominal surgeries.  She has not tried taking any medicines to help with her symptoms.  She has not noted anything that specifically makes her symptoms worse.  She's not been any recent antibiotic therapy.  She's not had any episodes similar to this.  Patient did have some lightheadedness and diaphoresis with this earlier and a presyncopal event that was associated with her diarrhea.  Patient is a 75 y.o. female presenting with abdominal pain. The history is provided by the patient. No language interpreter was used.  Abdominal Pain The primary symptoms of the illness include abdominal pain and diarrhea. The primary symptoms of the illness do not include fever, shortness of breath, nausea, vomiting, dysuria or vaginal discharge. The current episode started 3 to 5 hours ago. The onset of the illness was sudden. The problem has been gradually worsening.  The patient states that she believes she is currently not pregnant. The patient has had a change in bowel habit. Symptoms associated with the illness do not include chills, anorexia, diaphoresis or back pain.    Past Medical History  Diagnosis Date  . Hypertension     Past Surgical History  Procedure Date  . Tonsillectomy     No family history  on file.  History  Substance Use Topics  . Smoking status: Never Smoker   . Smokeless tobacco: Not on file  . Alcohol Use: Yes     1-2 drinks/day    OB History    Grav Para Term Preterm Abortions TAB SAB Ect Mult Living                  Review of Systems  Constitutional: Negative.  Negative for fever, chills and diaphoresis.  HENT: Negative.   Eyes: Negative.  Negative for discharge and redness.  Respiratory: Negative.  Negative for cough and shortness of breath.   Cardiovascular: Negative.  Negative for chest pain.  Gastrointestinal: Positive for abdominal pain, diarrhea and blood in stool. Negative for nausea, vomiting and anorexia.  Genitourinary: Negative.  Negative for dysuria and vaginal discharge.  Musculoskeletal: Negative.  Negative for back pain.  Skin: Negative.  Negative for color change and rash.  Neurological: Positive for light-headedness. Negative for syncope and headaches.  Hematological: Negative.  Negative for adenopathy.  Psychiatric/Behavioral: Negative.  Negative for confusion.  All other systems reviewed and are negative.    Allergies  Review of patient's allergies indicates no known allergies.  Home Medications   Current Outpatient Rx  Name Route Sig Dispense Refill  . AMLODIPINE BESYLATE 10 MG PO TABS Oral Take 10 mg by mouth daily.     . ASPIRIN 81 MG PO TABS Oral Take 81 mg by mouth daily.      Marland Kitchen LISINOPRIL-HYDROCHLOROTHIAZIDE 20-12.5 MG PO TABS Oral Take 2 tablets by  mouth daily.       BP 122/53  Pulse 65  Temp(Src) 97.7 F (36.5 C) (Oral)  Resp 18  SpO2 99%  Physical Exam  Constitutional: She is oriented to person, place, and time. She appears well-developed and well-nourished.  Non-toxic appearance. She does not have a sickly appearance.  HENT:  Head: Normocephalic and atraumatic.  Eyes: Conjunctivae, EOM and lids are normal. Pupils are equal, round, and reactive to light. No scleral icterus.  Neck: Trachea normal and normal range  of motion. Neck supple.  Cardiovascular: Normal rate, regular rhythm and normal heart sounds.   Pulmonary/Chest: Effort normal and breath sounds normal.  Abdominal: Soft. Normal appearance. There is no tenderness. There is no rebound, no guarding and no CVA tenderness.  Genitourinary:       Patient's Hemoccult is grossly positive as her stool appears brown mixed with significant amount of red and is strongly Hemoccult-positive  Musculoskeletal: Normal range of motion.  Neurological: She is alert and oriented to person, place, and time. She has normal strength.  Skin: Skin is warm, dry and intact. No rash noted.  Psychiatric: She has a normal mood and affect. Her behavior is normal. Judgment and thought content normal.    ED Course  Procedures (including critical care time)  Labs Reviewed  CBC - Abnormal; Notable for the following:    WBC 18.8 (*)    All other components within normal limits  DIFFERENTIAL - Abnormal; Notable for the following:    Neutrophils Relative 88 (*)    Neutro Abs 16.6 (*)    Lymphocytes Relative 5 (*)    Monocytes Absolute 1.2 (*)    All other components within normal limits  BASIC METABOLIC PANEL - Abnormal; Notable for the following:    Glucose, Bld 176 (*)    GFR calc non Af Amer 83 (*)    All other components within normal limits  OCCULT BLOOD, POC DEVICE  TYPE AND SCREEN  PROTIME-INR  APTT  ABO/RH  POCT OCCULT BLOOD STOOL, DEVICE  STOOL CULTURE   Ct Abdomen Pelvis W Contrast  10/13/2011  *RADIOLOGY REPORT*  Clinical Data: Diarrhea, abdominal pain.  CT ABDOMEN AND PELVIS WITH CONTRAST  Technique:  Multidetector CT imaging of the abdomen and pelvis was performed following the standard protocol during bolus administration of intravenous contrast.  Contrast: OMNIPAQUE IOHEXOL 300 MG/ML IV SOLN  Comparison:  None  Findings: Limited images through the lung bases demonstrate no significant appreciable abnormality. The heart size is within normal  limits. No pleural or pericardial effusion.  Several hepatic hypodensities are nonspecific however nonaggressive appearing.  No biliary ductal dilatation.  Unremarkable spleen, pancreas, adrenal glands.  There are a couple tiny hypodensities within the lower poles of the kidneys. No hydronephrosis.  No hydroureter.  Colonic diverticulosis.  No overt evidence for diverticulitis.  No bowel obstruction.  Normal appendix.  No free intraperitoneal air or fluid.  No lymphadenopathy.  There is scattered atherosclerotic calcification of the aorta and its branches. No aneurysmal dilatation.  Thin-walled bladder.  Unremarkable uterus.  Right adnexal lesion with punctate calcifications may reflect an exophytic fibroid or ovarian lesion in can be followed up with a non emergent ultrasound.  No acute osseous abnormality.  L4-5 DDD.  IMPRESSION: Colonic diverticulosis without overt evidence for diverticulitis.  2.7 cm right adnexal lesion with associated punctate calcifications may represent an exophytic fibroid or ovarian process.  Consider non emergent ultrasound follow-up.  Original Report Authenticated By: Waneta Martins, M.D.  No diagnosis found.    MDM  Patient has a normal hemoglobin here but has new onset of rectal bleeding which is concerning and I believe needs to be monitored for serial hemoglobin checks.  Patient did have an elevated white blood cell count on her laboratory studies was concerned me that she may have possible colitis or diverticulitis although on her CAT scan these were not found.  Given that her CAT scan did not find these I do not feel she needs antibiotics at this point in time.  Patient is maintaining her blood pressure and heart rate here without significant IV fluid resuscitation.  I will continue to keep her n.p.o. and contact tried for admission of this patient for observation overnight and serial hemoglobins.        Nat Christen, MD 10/14/11 0129  Pt to be  admitted to [facility], med-surg bed.    Nat Christen, MD 10/14/11 [phone]

## 2011-10-15 ENCOUNTER — Encounter (HOSPITAL_COMMUNITY)
Admission: EM | Disposition: A | Discharge status: Home / Self Care | Payer: Self-pay | Source: Home / Self Care | Attending: Internal Medicine

## 2011-10-15 ENCOUNTER — Encounter (HOSPITAL_COMMUNITY): Payer: Self-pay | Admitting: Gastroenterology

## 2011-10-15 ENCOUNTER — Other Ambulatory Visit: Payer: Self-pay | Admitting: Gastroenterology

## 2011-10-15 HISTORY — PX: COLONOSCOPY: SHX5424

## 2011-10-15 LAB — BASIC METABOLIC PANEL
BUN: 5 mg/dL — ABNORMAL LOW (ref 6–23)
CO2: 26 mEq/L (ref 19–32)
Chloride: 101 mEq/L (ref 96–112)
Glucose, Bld: 114 mg/dL — ABNORMAL HIGH (ref 70–99)
Potassium: 2.9 mEq/L — ABNORMAL LOW (ref 3.5–5.1)

## 2011-10-15 LAB — CBC
HCT: 36.6 % (ref 36.0–46.0)
Hemoglobin: 13.1 g/dL (ref 12.0–15.0)
MCHC: 35.8 g/dL (ref 30.0–36.0)
WBC: 12 10*3/uL — ABNORMAL HIGH (ref 4.0–10.5)

## 2011-10-15 SURGERY — COLONOSCOPY
Anesthesia: Moderate Sedation

## 2011-10-15 MED ORDER — MIDAZOLAM HCL 10 MG/2ML IJ SOLN
INTRAMUSCULAR | Status: AC
  Filled 2011-10-15: qty 2

## 2011-10-15 MED ORDER — MIDAZOLAM HCL 10 MG/2ML IJ SOLN
INTRAMUSCULAR | Status: DC | PRN
  Administered 2011-10-15 (×2): 1 mg via INTRAVENOUS
  Administered 2011-10-15 (×2): 2 mg via INTRAVENOUS
  Administered 2011-10-15: 1 mg via INTRAVENOUS

## 2011-10-15 MED ORDER — FENTANYL CITRATE 0.05 MG/ML IJ SOLN
INTRAMUSCULAR | Status: AC
  Filled 2011-10-15: qty 2

## 2011-10-15 MED ORDER — SODIUM CHLORIDE 0.9 % IV SOLN
INTRAVENOUS | Status: DC
  Administered 2011-10-15: 20 mL via INTRAVENOUS
  Administered 2011-10-16: 03:00:00 via INTRAVENOUS

## 2011-10-15 MED ORDER — DIPHENHYDRAMINE HCL 50 MG/ML IJ SOLN
INTRAMUSCULAR | Status: AC
  Filled 2011-10-15: qty 1

## 2011-10-15 MED ORDER — POTASSIUM CHLORIDE CRYS ER 20 MEQ PO TBCR
40.0000 meq | EXTENDED_RELEASE_TABLET | ORAL | Status: AC
  Administered 2011-10-15 (×2): 40 meq via ORAL
  Filled 2011-10-15 (×3): qty 2

## 2011-10-15 MED ORDER — FENTANYL CITRATE 0.05 MG/ML IJ SOLN
INTRAMUSCULAR | Status: DC | PRN
  Administered 2011-10-15 (×2): 10 ug via INTRAVENOUS
  Administered 2011-10-15 (×2): 20 ug via INTRAVENOUS

## 2011-10-15 NOTE — OR Nursing (Signed)
Vs did not flow to epic. Printed and posted to shadow chart

## 2011-10-15 NOTE — H&P (Addendum)
I have reviewed the patient's medical history and examined the patient today. There is no significant change in their medical condition since their previous H&P noted on the chart. It is noted that the patient is clinically improved since admission, as noted in my progress note from today.

## 2011-10-15 NOTE — Progress Notes (Signed)
  Doing much better.  Still has watery diarrhea, but less in volume, less frequent, less blood.  No real abdominal pain--some residual soreness.  Afebrile, NAD, abd soft w/ no tenderness.  WBC improved 12.0, Hgb stable.   K low at 2.9  IMPR:   C diff, ?superimposed ischemic colitis  PLAN:  Discussed option of colonoscopy to clarify dx--pt in favor.  Risks reviewed.  Florencia Reasons

## 2011-10-15 NOTE — Progress Notes (Signed)
Subjective: Status post colonoscopy, she states at feeling better today with more formed and less frequent stools. Also decreased  abdominal pain. Objective: Vital signs in last 24 hours: Temp:  [97.7 F (36.5 C)-99.2 F (37.3 C)] 98.5 F (36.9 C) (11/06 1200) Pulse Rate:  [77-88] 87  (11/06 1200) Resp:  [14-22] 22  (11/06 1418) BP: (100-153)/(47-91) 138/63 mmHg (11/06 1418) SpO2:  [97 %-100 %] 97 % (11/06 1418) Last BM Date: 10/14/11 Intake/Output from previous day: 11/05 0701 - 11/06 0700 In: 2888 [P.O.:720; I.V.:2168] Out: 5 [Urine:2; Stool:3] Intake/Output this shift:      General Appearance:    Alert, cooperative, no distress, appears stated age  Lungs:     Clear to auscultation bilaterally, respirations unlabored   Heart:    Regular rate and rhythm, S1 and S2 normal, no murmur, rub   or gallop  Abdomen:     Soft, non-tender, bowel sounds active all four quadrants,    no masses, no organomegaly  Extremities:   Extremities normal, atraumatic, no cyanosis or edema       Weight change:   Intake/Output Summary (Last 24 hours) at 10/15/11 1543 Last data filed at 10/15/11 0500  Gross per 24 hour  Intake   1815 ml  Output      4 ml  Net   1811 ml    Lab Results:   Basename 10/15/11 0425 10/13/11 1800  NA 135 135  K 2.9* 4.0  CL 101 99  CO2 26 25  GLUCOSE 114* 176*  BUN 5* 17  CREATININE 0.66 0.72  CALCIUM 8.4 9.7    Basename 10/15/11 0425 10/14/11 0438  WBC 12.0* 13.6*  HGB 13.1 13.1  HCT 36.6 36.8  PLT 224 244  MCV 89.5 90.4   PT/INR  Basename 10/13/11 1800  LABPROT 13.0  INR 0.96   ABG No results found for this basename: PHART:2,PCO2:2,PO2:2,HCO3:2 in the last 72 hours  Micro Results: Recent Results (from the past 240 hour(s))  STOOL CULTURE     Status: Normal (Preliminary result)   Collection Time   10/13/11  6:20 PM      Component Value Range Status Comment   Specimen Description STOOL   Final    Special Requests NONE   Final    Culture  Culture reincubated for better growth   Final    Report Status PENDING   Incomplete   CLOSTRIDIUM DIFFICILE BY PCR     Status: Abnormal   Collection Time   10/14/11  9:58 AM      Component Value Range Status Comment   C difficile by pcr POSITIVE (*) NEGATIVE  Final    Studies/Results: Ct Abdomen Pelvis W Contrast  10/13/2011  *RADIOLOGY REPORT*  Clinical Data: Diarrhea, abdominal pain.  CT ABDOMEN AND PELVIS WITH CONTRAST  Technique:  Multidetector CT imaging of the abdomen and pelvis was performed following the standard protocol during bolus administration of intravenous contrast.  Contrast: OMNIPAQUE IOHEXOL 300 MG/ML IV SOLN  Comparison:  None  Findings: Limited images through the lung bases demonstrate no significant appreciable abnormality. The heart size is within normal limits. No pleural or pericardial effusion.  Several hepatic hypodensities are nonspecific however nonaggressive appearing.  No biliary ductal dilatation.  Unremarkable spleen, pancreas, adrenal glands.  There are a couple tiny hypodensities within the lower poles of the kidneys. No hydronephrosis.  No hydroureter.  Colonic diverticulosis.  No overt evidence for diverticulitis.  No bowel obstruction.  Normal appendix.  No free intraperitoneal  air or fluid.  No lymphadenopathy.  There is scattered atherosclerotic calcification of the aorta and its branches. No aneurysmal dilatation.  Thin-walled bladder.  Unremarkable uterus.  Right adnexal lesion with punctate calcifications may reflect an exophytic fibroid or ovarian lesion in can be followed up with a non emergent ultrasound.  No acute osseous abnormality.  L4-5 DDD.  IMPRESSION: Colonic diverticulosis without overt evidence for diverticulitis.  2.7 cm right adnexal lesion with associated punctate calcifications may represent an exophytic fibroid or ovarian process.  Consider non emergent ultrasound follow-up.  Original Report Authenticated By: Waneta Martins, M.D.     Scheduled Meds:   . amLODipine  10 mg Oral Daily  . lisinopril  40 mg Oral Daily   And  . hydrochlorothiazide  25 mg Oral Daily  . metroNIDAZOLE  500 mg Oral Q8H  . potassium chloride  40 mEq Oral Q4H  . saccharomyces boulardii  250 mg Oral BID  . DISCONTD: ciprofloxacin  400 mg Intravenous Q12H  . DISCONTD: metronidazole  500 mg Intravenous Q8H  . DISCONTD: metroNIDAZOLE  500 mg Oral Q8H   Continuous Infusions:   . sodium chloride 75 mL/hr at 10/15/11 0608  . sodium chloride 20 mL (10/15/11 1231)   PRN Meds:.acetaminophen, acetaminophen, alum & mag hydroxide-simeth, HYDROmorphone, ondansetron, oxyCODONE, senna, zolpidem, DISCONTD: fentaNYL, DISCONTD: midazolam Assessment/Plan:  1.Clostridium difficile colitis-  on oral Flagyl and florastor , improving with stools more formed and less frequent. Status post colonoscopy today with? Superimposed ischemic colitis him. Appreciate GI assistance. 2. Hypertension-okay control outpatient medications. 3.Hypokalemia-replace potassium   LOS: 2 days   Myshawn Chiriboga C 10/15/2011, 3:43 PM

## 2011-10-15 NOTE — Progress Notes (Signed)
Nursing Note: Pt. To endo for limited colonoscopy. Pt expressed understanding of procedure, signed consent with no questions asked. Transport to endo suite in bed, contact precautions for Cdiff. VSS. Rondall Allegra, RN

## 2011-10-16 DIAGNOSIS — K621 Rectal polyp: Secondary | ICD-10-CM | POA: Clinically undetermined

## 2011-10-16 DIAGNOSIS — E876 Hypokalemia: Secondary | ICD-10-CM | POA: Diagnosis not present

## 2011-10-16 DIAGNOSIS — I10 Essential (primary) hypertension: Secondary | ICD-10-CM | POA: Diagnosis not present

## 2011-10-16 DIAGNOSIS — K55039 Acute (reversible) ischemia of large intestine, extent unspecified: Secondary | ICD-10-CM | POA: Diagnosis present

## 2011-10-16 DIAGNOSIS — K5730 Diverticulosis of large intestine without perforation or abscess without bleeding: Secondary | ICD-10-CM | POA: Diagnosis present

## 2011-10-16 DIAGNOSIS — A0472 Enterocolitis due to Clostridium difficile, not specified as recurrent: Secondary | ICD-10-CM | POA: Diagnosis present

## 2011-10-16 DIAGNOSIS — N9489 Other specified conditions associated with female genital organs and menstrual cycle: Secondary | ICD-10-CM | POA: Diagnosis present

## 2011-10-16 LAB — BASIC METABOLIC PANEL
CO2: 25 mEq/L (ref 19–32)
Calcium: 8.2 mg/dL — ABNORMAL LOW (ref 8.4–10.5)
Creatinine, Ser: 0.69 mg/dL (ref 0.50–1.10)
Glucose, Bld: 98 mg/dL (ref 70–99)

## 2011-10-16 LAB — CBC
HCT: 32.3 % — ABNORMAL LOW (ref 36.0–46.0)
Hemoglobin: 11.5 g/dL — ABNORMAL LOW (ref 12.0–15.0)
MCH: 32.2 pg (ref 26.0–34.0)
MCV: 90.5 fL (ref 78.0–100.0)
RBC: 3.57 MIL/uL — ABNORMAL LOW (ref 3.87–5.11)

## 2011-10-16 LAB — STOOL CULTURE

## 2011-10-16 MED ORDER — POTASSIUM CHLORIDE CRYS ER 20 MEQ PO TBCR: 40.0000 meq | EXTENDED_RELEASE_TABLET | Freq: Every day | ORAL | Status: DC

## 2011-10-16 MED ORDER — POTASSIUM CHLORIDE CRYS ER 20 MEQ PO TBCR
40.0000 meq | EXTENDED_RELEASE_TABLET | Freq: Once | ORAL | Status: DC
  Filled 2011-10-16: qty 2

## 2011-10-16 MED ORDER — METRONIDAZOLE 500 MG PO TABS: 500.0000 mg | ORAL_TABLET | Freq: Three times a day (TID) | ORAL | Status: AC

## 2011-10-16 MED ORDER — SACCHAROMYCES BOULARDII 250 MG PO CAPS: 250.0000 mg | ORAL_CAPSULE | Freq: Two times a day (BID) | ORAL | Status: AC

## 2011-10-16 NOTE — Progress Notes (Signed)
Pt stated that she would get her new prescription filled of KCL and take at home.  Pt does not wish to wait any longer for KCL to arrive from pharmacy.

## 2011-10-16 NOTE — Discharge Summary (Signed)
Physician Discharge Summary  Patient ID: Joan Cook MRN: [id number] DOB/AGE: 1936-09-19 75 y.o.  Admit date: 10/13/2011 Discharge date: 10/16/2011  Admission Diagnoses: C. difficile diarrhea, hypotension  Discharge Diagnoses:  Principal Problem:  *C. difficile diarrhea Active Problems:  Hypokalemia  HTN (hypertension)  Diverticulosis of sigmoid colon  Rectal polyp  Adnexal mass   Discharged Condition: stable . Patient is stable to be discharged home.  Hospital Course: This is a 75 year old, Caucasian female, who presented to the hospital with the onset of diarrhea, dizziness. She was found to have bloody stools. She was hypotensive at the time of admission. Patient was given fluids. Her stool was sent to the lab for testing. The stool did grow out C. difficile.  #1 bloody diarrhea: Patient was found to be positive for C. difficile. Patient was started on Flagyl and initially was also started on ciprofloxacin. The ciprofloxacin was subsequently discontinued. Because of the hematochezia gastroenterology was also involved in the patient's care. Patient underwent limited colonoscopy done by Dr. Matthias Hughs. This procedure showed evidence for ischemic colitis. No pseudo-membranous colitis was seen. Sigmoid diverticulosis and a rectal polyp was also seen. Patient's diarrhea has improved. She still has some loose stools, but not large quantities. She denies any abdominal pain, nausea, vomiting. She denies any blood in the stool.  #2 C. difficile diarrhea: This is her first episode. She thinks she may have contracted this illness through her  ex-husband. She'll be given a 14 day course of Flagyl. Florastor will also be prescribed.  #3 hypotension: The reason for this is not entirely clear. However, may have had something to do with her diarrhea. This probably contributed to her ischemic colitis. The patient's blood pressure had been stable in the last few days and actually she has been requiring  antihypertensive agents.  #4 hypokalemia. This will be repleted prior to discharge. She'll be given a prescription for potassium chloride as well.  #5 right adnexal lesion: Have discussed this with the patient and I have told her to discuss this issue further with her primary care physician. Patient will require a pelvic ultrasound as an outpatient.  #6 regarding the rectal polyp, and the sigmoid diverticulosis, colon. Will defer this issue to PCP and to the gastroenterologist. She denies any abdominal pain, nausea, or vomiting.  Consults: GI  Significant Diagnostic Studies: #1. Patient underwent  limited colonoscopy. And this showed evidence for ischemic colitis sigmoid diverticulosis and a rectal polyp.  And #2 Ct Abdomen Pelvis W Contrast  10/13/2011  *RADIOLOGY REPORT*  Clinical Data: Diarrhea, abdominal pain.  CT ABDOMEN AND PELVIS WITH CONTRAST  Technique:  Multidetector CT imaging of the abdomen and pelvis was performed following the standard protocol during bolus administration of intravenous contrast.  Contrast: OMNIPAQUE IOHEXOL 300 MG/ML IV SOLN  Comparison:  None  Findings: Limited images through the lung bases demonstrate no significant appreciable abnormality. The heart size is within normal limits. No pleural or pericardial effusion.  Several hepatic hypodensities are nonspecific however nonaggressive appearing.  No biliary ductal dilatation.  Unremarkable spleen, pancreas, adrenal glands.  There are a couple tiny hypodensities within the lower poles of the kidneys. No hydronephrosis.  No hydroureter.  Colonic diverticulosis.  No overt evidence for diverticulitis.  No bowel obstruction.  Normal appendix.  No free intraperitoneal air or fluid.  No lymphadenopathy.  There is scattered atherosclerotic calcification of the aorta and its branches. No aneurysmal dilatation.  Thin-walled bladder.  Unremarkable uterus.  Right adnexal lesion with punctate calcifications may  reflect an  exophytic fibroid or ovarian lesion in can be followed up with a non emergent ultrasound.  No acute osseous abnormality.  L4-5 DDD.  IMPRESSION: Colonic diverticulosis without overt evidence for diverticulitis.  2.7 cm right adnexal lesion with associated punctate calcifications may represent an exophytic fibroid or ovarian process.  Consider non emergent ultrasound follow-up.  Original Report Authenticated By: Waneta Martins, M.D.    Results for orders placed during the hospital encounter of 10/13/11 (from the past 72 hour(s))  CBC     Status: Abnormal   Collection Time   10/13/11  6:00 PM      Component Value Range Comment   WBC 18.8 (*) 4.0 - 10.5 (K/uL)    RBC 4.37  3.87 - 5.11 (MIL/uL)    Hemoglobin 14.1  12.0 - 15.0 (g/dL)    HCT 16.1  09.6 - 04.5 (%)    MCV 90.4  78.0 - 100.0 (fL)    MCH 32.3  26.0 - 34.0 (pg)    MCHC 35.7  30.0 - 36.0 (g/dL)    RDW 40.9  81.1 - 91.4 (%)    Platelets 278  150 - 400 (K/uL)   DIFFERENTIAL     Status: Abnormal   Collection Time   10/13/11  6:00 PM      Component Value Range Comment   Neutrophils Relative 88 (*) 43 - 77 (%)    Neutro Abs 16.6 (*) 1.7 - 7.7 (K/uL)    Lymphocytes Relative 5 (*) 12 - 46 (%)    Lymphs Abs 0.9  0.7 - 4.0 (K/uL)    Monocytes Relative 7  3 - 12 (%)    Monocytes Absolute 1.2 (*) 0.1 - 1.0 (K/uL)    Eosinophils Relative 0  0 - 5 (%)    Eosinophils Absolute 0.0  0.0 - 0.7 (K/uL)    Basophils Relative 0  0 - 1 (%)    Basophils Absolute 0.0  0.0 - 0.1 (K/uL)   BASIC METABOLIC PANEL     Status: Abnormal   Collection Time   10/13/11  6:00 PM      Component Value Range Comment   Sodium 135  135 - 145 (mEq/L)    Potassium 4.0  3.5 - 5.1 (mEq/L)    Chloride 99  96 - 112 (mEq/L)    CO2 25  19 - 32 (mEq/L)    Glucose, Bld 176 (*) 70 - 99 (mg/dL)    BUN 17  6 - 23 (mg/dL)    Creatinine, Ser 7.82  0.50 - 1.10 (mg/dL)    Calcium 9.7  8.4 - 10.5 (mg/dL)    GFR calc non Af Amer 83 (*) >90 (mL/min)    GFR calc Af Amer >90  >90  (mL/min)   PROTIME-INR     Status: Normal   Collection Time   10/13/11  6:00 PM      Component Value Range Comment   Prothrombin Time 13.0  11.6 - 15.2 (seconds)    INR 0.96  0.00 - 1.49    APTT     Status: Normal   Collection Time   10/13/11  6:00 PM      Component Value Range Comment   aPTT 27  24 - 37 (seconds)   OCCULT BLOOD, POC DEVICE     Status: Normal   Collection Time   10/13/11  6:14 PM      Component Value Range Comment   Fecal Occult Bld POSITIVE  STOOL CULTURE     Status: Normal (Preliminary result)   Collection Time   10/13/11  6:20 PM      Component Value Range Comment   Specimen Description STOOL      Special Requests NONE      Culture Culture reincubated for better growth      Report Status PENDING     TYPE AND SCREEN     Status: Normal   Collection Time   10/13/11  6:41 PM      Component Value Range Comment   ABO/RH(D) A POS      Antibody Screen NEG      Sample Expiration 10/16/2011     ABO/RH     Status: Normal   Collection Time   10/13/11  7:22 PM      Component Value Range Comment   ABO/RH(D) A POS     CBC     Status: Abnormal   Collection Time   10/14/11  4:38 AM      Component Value Range Comment   WBC 13.6 (*) 4.0 - 10.5 (K/uL)    RBC 4.07  3.87 - 5.11 (MIL/uL)    Hemoglobin 13.1  12.0 - 15.0 (g/dL)    HCT 16.1  09.6 - 04.5 (%)    MCV 90.4  78.0 - 100.0 (fL)    MCH 32.2  26.0 - 34.0 (pg)    MCHC 35.6  30.0 - 36.0 (g/dL)    RDW 40.9  81.1 - 91.4 (%)    Platelets 244  150 - 400 (K/uL)   CLOSTRIDIUM DIFFICILE BY PCR     Status: Abnormal   Collection Time   10/14/11  9:58 AM      Component Value Range Comment   C difficile by pcr POSITIVE (*) NEGATIVE    CBC     Status: Abnormal   Collection Time   10/15/11  4:25 AM      Component Value Range Comment   WBC 12.0 (*) 4.0 - 10.5 (K/uL)    RBC 4.09  3.87 - 5.11 (MIL/uL)    Hemoglobin 13.1  12.0 - 15.0 (g/dL)    HCT 78.2  95.6 - 21.3 (%)    MCV 89.5  78.0 - 100.0 (fL)    MCH 32.0  26.0 - 34.0  (pg)    MCHC 35.8  30.0 - 36.0 (g/dL)    RDW 08.6  57.8 - 46.9 (%)    Platelets 224  150 - 400 (K/uL)   BASIC METABOLIC PANEL     Status: Abnormal   Collection Time   10/15/11  4:25 AM      Component Value Range Comment   Sodium 135  135 - 145 (mEq/L)    Potassium 2.9 (*) 3.5 - 5.1 (mEq/L)    Chloride 101  96 - 112 (mEq/L)    CO2 26  19 - 32 (mEq/L)    Glucose, Bld 114 (*) 70 - 99 (mg/dL)    BUN 5 (*) 6 - 23 (mg/dL)    Creatinine, Ser 6.29  0.50 - 1.10 (mg/dL)    Calcium 8.4  8.4 - 10.5 (mg/dL)    GFR calc non Af Amer 86 (*) >90 (mL/min)    GFR calc Af Amer >90  >90 (mL/min)   CBC     Status: Abnormal   Collection Time   10/16/11  4:19 AM      Component Value Range Comment   WBC 9.4  4.0 - 10.5 (K/uL)  RBC 3.57 (*) 3.87 - 5.11 (MIL/uL)    Hemoglobin 11.5 (*) 12.0 - 15.0 (g/dL)    HCT 16.1 (*) 09.6 - 46.0 (%)    MCV 90.5  78.0 - 100.0 (fL)    MCH 32.2  26.0 - 34.0 (pg)    MCHC 35.6  30.0 - 36.0 (g/dL)    RDW 04.5  40.9 - 81.1 (%)    Platelets 198  150 - 400 (K/uL)   BASIC METABOLIC PANEL     Status: Abnormal   Collection Time   10/16/11  4:19 AM      Component Value Range Comment   Sodium 133 (*) 135 - 145 (mEq/L)    Potassium 3.4 (*) 3.5 - 5.1 (mEq/L)    Chloride 101  96 - 112 (mEq/L)    CO2 25  19 - 32 (mEq/L)    Glucose, Bld 98  70 - 99 (mg/dL)    BUN 8  6 - 23 (mg/dL)    Creatinine, Ser 9.14  0.50 - 1.10 (mg/dL)    Calcium 8.2 (*) 8.4 - 10.5 (mg/dL)    GFR calc non Af Amer 84 (*) >90 (mL/min)    GFR calc Af Amer >90  >90 (mL/min)        Discharge Exam: Blood pressure 117/68, pulse 76, temperature 98 F (36.7 C), temperature source Oral, resp. rate 16, height 5\' 10"  (1.778 m), weight 70.2 kg (154 lb 12.2 oz), SpO2 96.00%. Physical Exam  Vitals reviewed. Constitutional: She is oriented to person, place, and time. She appears well-developed and well-nourished.  HENT:  Head: Normocephalic and atraumatic.  Mouth/Throat: Oropharynx is clear and moist. No  oropharyngeal exudate.  Eyes: Pupils are equal, round, and reactive to light. Right eye exhibits no discharge. Left eye exhibits no discharge.  Neck: Normal range of motion. No thyromegaly present.  Cardiovascular: Normal rate and regular rhythm.   No murmur heard. Pulmonary/Chest: Effort normal. No respiratory distress. She has no wheezes. She has no rales.  Abdominal: Soft. She exhibits no distension and no mass. There is no tenderness. There is no rebound and no guarding.  Musculoskeletal: Normal range of motion.  Neurological: She is alert and oriented to person, place, and time. No cranial nerve deficit.  Skin: Skin is warm and dry.  Psychiatric: She has a normal mood and affect.     Discharge Orders    Future Orders Please Complete By Expires   Diet - low sodium heart healthy      Increase activity slowly      May walk up steps      No wound care      Discharge instructions      Comments:   FOLLOW UP WITH DR. Kathrene Bongo IN 7 DAYS. SEEK ATTENTION IF DIARRHEA GETS WORSE OR IF YOU DEVELOP ABDOMINAL PAIN, NAUSEA OR VOMITING OR FEVER.  PLEASE DISCUSS WITH DR. Kathrene Bongo REGARDING THE MASS FOUND IN THE PELVIC AREA ON THE RIGHT SIDE.     Current Discharge Medication List    START taking these medications   Details  metroNIDAZOLE (FLAGYL) 500 MG tablet Take 1 tablet (500 mg total) by mouth every 8 (eight) hours. Qty: 36 tablet, Refills: 0    potassium chloride SA (K-DUR,KLOR-CON) 20 MEQ tablet Take 2 tablets (40 mEq total) by mouth daily. Qty: 24 tablet, Refills: 0    saccharomyces boulardii (FLORASTOR) 250 MG capsule Take 1 capsule (250 mg total) by mouth 2 (two) times daily. Qty: 24 capsule, Refills: 0  CONTINUE these medications which have NOT CHANGED   Details  amLODipine (NORVASC) 10 MG tablet Take 10 mg by mouth daily.     lisinopril-hydrochlorothiazide (PRINZIDE,ZESTORETIC) 20-12.5 MG per tablet Take 2 tablets by mouth daily.       STOP taking these medications      aspirin 81 MG tablet        Follow-up Information    Make an appointment to follow up.   Contact information:   DR. Kathrene Bongo         Signed: Nadege Carriger 10/16/2011, 8:53 AM

## 2011-10-16 NOTE — Progress Notes (Signed)
Doing much better today. Small amounts of nonbloody diarrhea, but the stool is starting to form up somewhat. No pain. Colonoscopic findings reviewed.  Exam: Afebrile. No distress. Abdomen nontender today.  Labs: White count now normal, hemoglobin down slightly, consistent with hydration  Colonoscopic biopsies: Pending  Impression: #1 resolving ischemic colitis #2 positive test for C. difficile colitis, without corresponding colonoscopic findings  Recommendations #1 I reviewed the idea of ischemic colitis with the patient, including etiology which is unknown, prognosis, natural history, etc. #2 I agree with the plans for discharge, including a roughly 2 week course of metronidazole and high-dose Florastor probiotic therapy #3 await pathology results with office followup with either myself or Dr. Bosie Clos in the next few weeks to decide about possible followup colonoscopy.  Florencia Reasons, M.D.

## 2011-10-16 NOTE — Brief Op Note (Signed)
    Done as endoPro report (already e-signed).

## 2011-10-22 ENCOUNTER — Other Ambulatory Visit: Payer: Self-pay | Admitting: Family Medicine

## 2011-10-22 DIAGNOSIS — Z1231 Encounter for screening mammogram for malignant neoplasm of breast: Secondary | ICD-10-CM

## 2011-10-24 ENCOUNTER — Other Ambulatory Visit: Payer: Self-pay | Admitting: Family Medicine

## 2011-10-24 DIAGNOSIS — N9489 Other specified conditions associated with female genital organs and menstrual cycle: Secondary | ICD-10-CM

## 2011-10-25 ENCOUNTER — Ambulatory Visit
Admission: RE | Admit: 2011-10-25 | Discharge: 2011-10-25 | Disposition: A | Discharge status: Home / Self Care | Payer: BC Managed Care – PPO | Source: Ambulatory Visit | Attending: Family Medicine | Admitting: Family Medicine

## 2011-10-25 DIAGNOSIS — N9489 Other specified conditions associated with female genital organs and menstrual cycle: Secondary | ICD-10-CM

## 2011-11-01 ENCOUNTER — Encounter (HOSPITAL_COMMUNITY): Payer: Self-pay | Admitting: Gastroenterology

## 2011-11-14 ENCOUNTER — Ambulatory Visit
Admission: RE | Admit: 2011-11-14 | Discharge: 2011-11-14 | Disposition: A | Discharge status: Home / Self Care | Payer: BC Managed Care – PPO | Source: Ambulatory Visit | Attending: Family Medicine | Admitting: Family Medicine

## 2011-11-14 DIAGNOSIS — Z1231 Encounter for screening mammogram for malignant neoplasm of breast: Secondary | ICD-10-CM

## 2012-07-23 ENCOUNTER — Other Ambulatory Visit: Payer: Self-pay | Admitting: Family Medicine

## 2012-07-23 DIAGNOSIS — N9489 Other specified conditions associated with female genital organs and menstrual cycle: Secondary | ICD-10-CM

## 2012-07-24 ENCOUNTER — Ambulatory Visit
Admission: RE | Admit: 2012-07-24 | Discharge: 2012-07-24 | Disposition: A | Discharge status: Home / Self Care | Payer: Medicare Other | Source: Ambulatory Visit | Attending: Family Medicine | Admitting: Family Medicine

## 2012-07-24 DIAGNOSIS — N9489 Other specified conditions associated with female genital organs and menstrual cycle: Secondary | ICD-10-CM

## 2012-10-08 ENCOUNTER — Other Ambulatory Visit: Payer: Self-pay

## 2012-10-08 DIAGNOSIS — Z1231 Encounter for screening mammogram for malignant neoplasm of breast: Secondary | ICD-10-CM

## 2012-11-16 ENCOUNTER — Ambulatory Visit
Admission: RE | Admit: 2012-11-16 | Discharge: 2012-11-16 | Disposition: A | Discharge status: Home / Self Care | Payer: Medicare Other | Source: Ambulatory Visit

## 2012-11-16 DIAGNOSIS — Z1231 Encounter for screening mammogram for malignant neoplasm of breast: Secondary | ICD-10-CM

## 2012-12-08 ENCOUNTER — Other Ambulatory Visit: Payer: Self-pay

## 2012-12-08 DIAGNOSIS — N9489 Other specified conditions associated with female genital organs and menstrual cycle: Secondary | ICD-10-CM

## 2013-01-11 ENCOUNTER — Ambulatory Visit
Admission: RE | Admit: 2013-01-11 | Discharge: 2013-01-11 | Disposition: A | Discharge status: Home / Self Care | Payer: Medicare Other | Source: Ambulatory Visit

## 2013-01-11 DIAGNOSIS — N9489 Other specified conditions associated with female genital organs and menstrual cycle: Secondary | ICD-10-CM

## 2013-01-11 MED ORDER — GADOBENATE DIMEGLUMINE 529 MG/ML IV SOLN
14.0000 mL | Freq: Once | INTRAVENOUS | Status: AC | PRN
  Administered 2013-01-11: 14 mL via INTRAVENOUS

## 2013-10-20 ENCOUNTER — Other Ambulatory Visit: Payer: Self-pay

## 2013-10-20 DIAGNOSIS — Z1231 Encounter for screening mammogram for malignant neoplasm of breast: Secondary | ICD-10-CM

## 2013-11-02 ENCOUNTER — Other Ambulatory Visit: Payer: Self-pay | Admitting: Dermatology

## 2013-11-24 ENCOUNTER — Ambulatory Visit
Admission: RE | Admit: 2013-11-24 | Discharge: 2013-11-24 | Disposition: A | Discharge status: Home / Self Care | Payer: Medicare Other | Source: Ambulatory Visit

## 2013-11-24 DIAGNOSIS — Z1231 Encounter for screening mammogram for malignant neoplasm of breast: Secondary | ICD-10-CM

## 2013-12-08 ENCOUNTER — Other Ambulatory Visit: Payer: Self-pay | Admitting: Family Medicine

## 2013-12-08 DIAGNOSIS — R0989 Other specified symptoms and signs involving the circulatory and respiratory systems: Secondary | ICD-10-CM

## 2013-12-15 ENCOUNTER — Ambulatory Visit
Admission: RE | Admit: 2013-12-15 | Discharge: 2013-12-15 | Disposition: A | Discharge status: Home / Self Care | Payer: Medicare Other | Source: Ambulatory Visit | Attending: Family Medicine | Admitting: Family Medicine

## 2013-12-15 DIAGNOSIS — R0989 Other specified symptoms and signs involving the circulatory and respiratory systems: Secondary | ICD-10-CM

## 2014-10-27 ENCOUNTER — Other Ambulatory Visit: Payer: Self-pay

## 2014-10-27 DIAGNOSIS — Z1231 Encounter for screening mammogram for malignant neoplasm of breast: Secondary | ICD-10-CM

## 2014-11-28 ENCOUNTER — Ambulatory Visit
Admission: RE | Admit: 2014-11-28 | Discharge: 2014-11-28 | Disposition: A | Discharge status: Home / Self Care | Payer: Medicare Other | Source: Ambulatory Visit

## 2014-11-28 DIAGNOSIS — Z1231 Encounter for screening mammogram for malignant neoplasm of breast: Secondary | ICD-10-CM

## 2015-10-31 ENCOUNTER — Other Ambulatory Visit: Payer: Self-pay

## 2015-10-31 DIAGNOSIS — Z1231 Encounter for screening mammogram for malignant neoplasm of breast: Secondary | ICD-10-CM

## 2015-11-30 ENCOUNTER — Ambulatory Visit
Admission: RE | Admit: 2015-11-30 | Discharge: 2015-11-30 | Disposition: A | Discharge status: Home / Self Care | Payer: Medicare Other | Source: Ambulatory Visit

## 2015-11-30 DIAGNOSIS — Z1231 Encounter for screening mammogram for malignant neoplasm of breast: Secondary | ICD-10-CM

## 2016-11-13 ENCOUNTER — Other Ambulatory Visit: Payer: Self-pay | Admitting: Family Medicine

## 2016-11-13 DIAGNOSIS — Z1231 Encounter for screening mammogram for malignant neoplasm of breast: Secondary | ICD-10-CM

## 2016-12-03 ENCOUNTER — Ambulatory Visit
Admission: RE | Admit: 2016-12-03 | Discharge: 2016-12-03 | Disposition: A | Discharge status: Home / Self Care | Payer: Medicare Other | Source: Ambulatory Visit | Attending: Family Medicine | Admitting: Family Medicine

## 2016-12-03 DIAGNOSIS — Z1231 Encounter for screening mammogram for malignant neoplasm of breast: Secondary | ICD-10-CM

## 2016-12-27 ENCOUNTER — Other Ambulatory Visit: Payer: Self-pay | Admitting: Family Medicine

## 2016-12-27 DIAGNOSIS — R0989 Other specified symptoms and signs involving the circulatory and respiratory systems: Secondary | ICD-10-CM

## 2016-12-30 ENCOUNTER — Ambulatory Visit
Admission: RE | Admit: 2016-12-30 | Discharge: 2016-12-30 | Disposition: A | Discharge status: Home / Self Care | Payer: Medicare Other | Source: Ambulatory Visit | Attending: Family Medicine | Admitting: Family Medicine

## 2016-12-30 DIAGNOSIS — R0989 Other specified symptoms and signs involving the circulatory and respiratory systems: Secondary | ICD-10-CM

## 2017-01-20 ENCOUNTER — Encounter: Payer: Self-pay | Admitting: Vascular Surgery

## 2017-01-28 ENCOUNTER — Ambulatory Visit (INDEPENDENT_AMBULATORY_CARE_PROVIDER_SITE_OTHER): Payer: Medicare Other | Admitting: Vascular Surgery

## 2017-01-28 ENCOUNTER — Encounter: Payer: Self-pay | Admitting: Vascular Surgery

## 2017-01-28 VITALS — BP 136/82 | HR 89 | Temp 98.7°F | Resp 18 | Ht 70.0 in | Wt 156.7 lb

## 2017-01-28 DIAGNOSIS — I6529 Occlusion and stenosis of unspecified carotid artery: Secondary | ICD-10-CM

## 2017-01-28 NOTE — Progress Notes (Signed)
[facility]  Patient name: Joan Cook MRN: [id number] DOB: 04-Jul-1936 Sex: female  REASON FOR CONSULT: Asymptomatic left carotid stenosis  HPI: Joan Cook is a 81 y.o. female, who is seen today for evaluation of carotid disease. She is a very pleasant healthy 81 year old female. Several years ago she was found to have an asymptomatic carotid bruit in the duplex showed mild stenosis. Recently she had a repeat study in this showed some progression with a predicted 50-60% left internal carotid artery stenosis and plaque with no significant stenosis in the right carotid system. She is right-handed. She denies any prior history of amaurosis fugax, transient ischemic attack or stroke. She is healthy. Does have a history of hypertension but no history of cardiac disease.  Past Medical History:  Diagnosis Date  . Diverticulosis   . Hypertension     Family History  Problem Relation Age of Onset  . Hypertension Mother   . Heart disease Mother   . Hypertension Father     SOCIAL HISTORY: Social History   Social History  . Marital status: Divorced    Spouse name: N/A  . Number of children: N/A  . Years of education: N/A   Occupational History  . Not on file.   Social History Main Topics  . Smoking status: Never Smoker  . Smokeless tobacco: Never Used  . Alcohol use Yes     Comment: 1-2 drinks/day  . Drug use: No  . Sexual activity: Not on file   Other Topics Concern  . Not on file   Social History Narrative  . No narrative on file    Allergies  Allergen Reactions  . Tetracyclines & Related     Stomach upset    Current Outpatient Prescriptions  Medication Sig Dispense Refill  . amLODipine (NORVASC) 10 MG tablet Take 10 mg by mouth daily.     Marland Kitchen aspirin 81 MG tablet Take 81 mg by mouth daily.    Marland Kitchen atorvastatin (LIPITOR) 10 MG tablet Take 10 mg by mouth every evening.  3  . Cholecalciferol (VITAMIN  D-3) 1000 units CAPS Take by mouth.    . hydrochlorothiazide (HYDRODIURIL) 25 MG tablet Take 25 mg by mouth daily.  3  . lisinopril (PRINIVIL,ZESTRIL) 40 MG tablet Take 40 mg by mouth daily.  3  . lisinopril-hydrochlorothiazide (PRINZIDE,ZESTORETIC) 20-12.5 MG per tablet Take 2 tablets by mouth daily.     . potassium chloride SA (K-DUR,KLOR-CON) 20 MEQ tablet Take 2 tablets (40 mEq total) by mouth daily. 24 tablet 0   No current facility-administered medications for this visit.     REVIEW OF SYSTEMS:  [X]  denotes positive finding, [ ]  denotes negative finding Cardiac  Comments:  Chest pain or chest pressure:    Shortness of breath upon exertion:    Short of breath when lying flat:    Irregular heart rhythm:        Vascular    Pain in calf, thigh, or hip brought on by ambulation:    Pain in feet at night that wakes you up from your sleep:     Blood clot in your veins:    Leg swelling:         Pulmonary    Oxygen at home:    Productive cough:     Wheezing:         Neurologic    Sudden weakness in arms or legs:     Sudden numbness in arms or  legs:     Sudden onset of difficulty speaking or slurred speech:    Temporary loss of vision in one eye:     Problems with dizziness:         Gastrointestinal    Blood in stool:     Vomited blood:         Genitourinary    Burning when urinating:     Blood in urine:        Psychiatric    Major depression:         Hematologic    Bleeding problems:    Problems with blood clotting too easily:        Skin    Rashes or ulcers:        Constitutional    Fever or chills:      PHYSICAL EXAM: Vitals:   01/28/17 1515 01/28/17 1517  BP: (!) 144/84 136/82  Pulse: 89   Resp: 18   Temp: 98.7 F (37.1 C)   TempSrc: Oral   SpO2: 98%   Weight: 156 lb 11.2 oz (71.1 kg)   Height: 5\' 10"  (1.778 m)     GENERAL: The patient is a well-nourished female, in no acute distress. The vital signs are documented above. CARDIOVASCULAR: Carotid  arteries without bruits bilaterally. 2+ radial 2+ femoral 2+ popliteal and 2+ dorsalis pedis pulses bilaterally PULMONARY: There is good air exchange  ABDOMEN: Soft and non-tender. Prominent aortic pulsation with no tenderness  MUSCULOSKELETAL: There are no major deformities or cyanosis. NEUROLOGIC: No focal weakness or paresthesias are detected. SKIN: There are no ulcers or rashes noted. PSYCHIATRIC: The patient has a normal affect.  DATA:  I reviewed her carotid duplex from [facility]. Her systolic velocities suggest moderate left carotid stenosis  Also imaged old films of CT from November 2012 of her abdomen showing no evidence of aneurysm. She is thin and has calcification of her aorta with no flow limitation  MEDICAL ISSUES: Had long discussion with patient regarding significance of her asymptomatic carotid disease. I reviewed symptoms of carotid disease. I have recommended that we see her on a yearly basis with carotid duplex to rule out progression. I did explain symptoms of carotid disease and she knows to report immediately to the [facility] hospital if this is severe. We will follow-up with duplex in one year   Rosetta Posner, MD [facility] Tel [phone] Pager [phone]

## 2017-01-31 NOTE — Addendum Note (Signed)
Addended by: Lianne Cure A on: 01/31/2017 01:18 PM   Modules accepted: Orders

## 2017-10-24 ENCOUNTER — Other Ambulatory Visit: Payer: Self-pay | Admitting: Family Medicine

## 2017-10-24 DIAGNOSIS — Z1231 Encounter for screening mammogram for malignant neoplasm of breast: Secondary | ICD-10-CM

## 2017-12-04 ENCOUNTER — Ambulatory Visit
Admission: RE | Admit: 2017-12-04 | Discharge: 2017-12-04 | Disposition: A | Discharge status: Home / Self Care | Payer: Medicare Other | Source: Ambulatory Visit | Attending: Family Medicine | Admitting: Family Medicine

## 2017-12-04 DIAGNOSIS — Z1231 Encounter for screening mammogram for malignant neoplasm of breast: Secondary | ICD-10-CM

## 2018-02-03 ENCOUNTER — Encounter: Payer: Self-pay | Admitting: Family

## 2018-02-03 ENCOUNTER — Ambulatory Visit (HOSPITAL_COMMUNITY)
Admission: RE | Admit: 2018-02-03 | Discharge: 2018-02-03 | Disposition: A | Discharge status: Home / Self Care | Payer: Medicare Other | Source: Ambulatory Visit | Attending: Vascular Surgery | Admitting: Vascular Surgery

## 2018-02-03 ENCOUNTER — Ambulatory Visit: Payer: Medicare Other | Admitting: Family

## 2018-02-03 ENCOUNTER — Other Ambulatory Visit: Payer: Self-pay

## 2018-02-03 VITALS — BP 143/81 | HR 81 | Temp 97.4°F | Resp 14 | Wt 154.8 lb

## 2018-02-03 DIAGNOSIS — I6522 Occlusion and stenosis of left carotid artery: Secondary | ICD-10-CM | POA: Diagnosis not present

## 2018-02-03 DIAGNOSIS — R0989 Other specified symptoms and signs involving the circulatory and respiratory systems: Secondary | ICD-10-CM

## 2018-02-03 DIAGNOSIS — I6529 Occlusion and stenosis of unspecified carotid artery: Secondary | ICD-10-CM | POA: Insufficient documentation

## 2018-02-03 NOTE — Patient Instructions (Signed)

## 2018-02-03 NOTE — Progress Notes (Signed)
Chief Complaint: Follow up Extracranial Carotid Artery Stenosis   History of Present Illness  Joan Cook is a 82 y.o. female whom Dr. Donnetta Hutching has been monitoring for extracranial carotid artery stenosis. Several years ago she was found to have an asymptomatic carotid bruit; the duplex showed mild stenosis. More recently she had a repeat study which showed some progression with a predicted 50-60% left internal carotid artery stenosis and plaque with no significant stenosis in the right carotid system.  She is right-handed.  She denies any known history of stroke or TIA. Specifically she denies a history of amaurosis fugax or monocular blindness, unilateral facial drooping, hemiplegia, or receptive or expressive aphasia. She is healthy. Does have a history of hypertension but no history of cardiac disease.  Dr. Donnetta Hutching last evaluated pt on 01-28-17. At that time he reviewed her carotid duplex from [facility] imaging. Her systolic velocities suggest moderate left carotid stenosis He also reviewed old films of CT from November 2012 of her abdomen showing no evidence of aneurysm. She is thin and has calcification of her aorta with no flow limitation Dr. Donnetta Hutching had a long discussion with patient regarding significance of her asymptomatic carotid disease; he reviewed symptoms of carotid disease and recommended that we see her on a yearly basis with carotid duplex to rule out progression.  Patient has not had previous carotid artery intervention.  She denies claudication sx's in her legs with walking. She is an avid Firefighter since age 75.     Diabetic: no Tobacco use: non-smoker  Pt meds include: Statin : yes ASA: yes Other anticoagulants/antiplatelets: no   Past Medical History:  Diagnosis Date  . Diverticulosis   . Hypertension     Social History Social History   Tobacco Use  . Smoking status: Never Smoker  . Smokeless tobacco: Never Used  Substance Use Topics  . Alcohol  use: Yes    Comment: 1-2 drinks/day  . Drug use: No    Family History Family History  Problem Relation Age of Onset  . Hypertension Mother   . Heart disease Mother   . Hypertension Father   . Heart disease Son     Surgical History Past Surgical History:  Procedure Laterality Date  . COLONOSCOPY  10/15/2011   Procedure: COLONOSCOPY;  Surgeon: Cleotis Nipper, MD;  Location: [facility];  Service: Endoscopy;  Laterality: N/A;  unprepped exam; pt is C diff + (contact precautions)  . TONSILLECTOMY      Allergies  Allergen Reactions  . Tetracyclines & Related     Stomach upset    Current Outpatient Medications  Medication Sig Dispense Refill  . amLODipine (NORVASC) 10 MG tablet Take 10 mg by mouth daily.     Marland Kitchen aspirin 81 MG tablet Take 81 mg by mouth daily.    Marland Kitchen atorvastatin (LIPITOR) 10 MG tablet Take 10 mg by mouth every evening.  3  . Cholecalciferol (VITAMIN D-3) 1000 units CAPS Take by mouth.    . hydrochlorothiazide (HYDRODIURIL) 25 MG tablet Take 25 mg by mouth daily.  3  . lisinopril (PRINIVIL,ZESTRIL) 40 MG tablet Take 40 mg by mouth daily.  3  . lisinopril-hydrochlorothiazide (PRINZIDE,ZESTORETIC) 20-12.5 MG per tablet Take 2 tablets by mouth daily.     . potassium chloride SA (K-DUR,KLOR-CON) 20 MEQ tablet Take 2 tablets (40 mEq total) by mouth daily. (Patient not taking: Reported on 02/03/2018) 24 tablet 0   No current facility-administered medications for this visit.     Review  of Systems : See HPI for pertinent positives and negatives.  Physical Examination  Vitals:   02/03/18 1245 02/03/18 1247 02/03/18 1248 02/03/18 1249  BP: (!) 152/83 (!) 155/81 140/82 (!) 143/81  Pulse: 81     Resp: 14     Temp: (!) 97.4 F (36.3 C)     TempSrc: Oral     SpO2: 98%     Weight: 154 lb 12.8 oz (70.2 kg)      Body mass index is 22.21 kg/m.  General: WDWN fit appearing female in NAD GAIT: normal Eyes: PERRLA HENT: No gross abnormalities.  Pulmonary:   Respirations are non-labored, good air movement, CTAB, no rales, rhonchi, or wheezing. Cardiac: regular rhythm, no detected murmur.  VASCULAR EXAM Carotid Bruits Right Left   Negative Negative     Abdominal aortic pulse is palpable. Radial pulses are 2+ palpable and equal.                                                                                                                            LE Pulses Right Left       POPLITEAL  3+ palpable   2+ palpable       POSTERIOR TIBIAL   palpable   palpaple        DORSALIS PEDIS      ANTERIOR TIBIAL fainty palpable  Faintly  palpable     Gastrointestinal: soft, nontender, BS WNL, no r/g, no palpable masses. Musculoskeletal: No muscle atrophy/wasting. M/S 5/5 throughout, extremities without ischemic changes. Skin: No rashes, no ulcers, no cellulitis.   Neurologic:  A&O X 3; appropriate affect, sensation is normal; speech is normal, CN 2-12 intact, pain and light touch intact in extremities, motor exam as listed above. Psychiatric: Normal thought content, mood appropriate to clinical situation.     Assessment: Joan Cook is a 82 y.o. female who has not history of stroke or TIA.  She has a history of carotid bruit which I do not detect today.   She has prominent bilateral popliteal pulses, has no claudication sx's with walking. She is an avid Firefighter.    DATA Carotid Duplex (02/03/18): There is no stenosis of the bilateral ICA. Bilateral vertebral artery flow is antegrade.  Bilateral subclavian artery waveforms are normal.     Plan: Follow-up in 1 year with Carotid Duplex scan and bilateral popliteal artery duplex.    I discussed in depth with the patient the nature of atherosclerosis, and emphasized the importance of maximal medical management including strict control of blood pressure, blood glucose, and lipid levels, obtaining regular exercise, and continued cessation of smoking.  The patient is aware that without  maximal medical management the underlying atherosclerotic disease process will progress, limiting the benefit of any interventions. The patient was given information about stroke prevention and what symptoms should prompt the patient to seek immediate medical care. Thank you for allowing [place] to participate in this patient's care.  Vinnie Level Nickel,  RN, MSN, FNP-C [facility] [place] Office: [phone]  Clinic Physician: Early  02/03/18 1:09 PM

## 2018-02-04 LAB — VAS US CAROTID
LCCADDIAS: 16 cm/s
LCCADSYS: 88 cm/s
LCCAPSYS: 93 cm/s
LEFT ECA DIAS: -11 cm/s
LEFT VERTEBRAL DIAS: 7 cm/s
LICADDIAS: -24 cm/s
LICADSYS: -103 cm/s
LICAPSYS: -79 cm/s
Left CCA prox dias: 13 cm/s
Left ICA prox dias: -12 cm/s
RCCAPSYS: 94 cm/s
RIGHT CCA MID DIAS: 12 cm/s
RIGHT ECA DIAS: 14 cm/s
RIGHT VERTEBRAL DIAS: 15 cm/s
Right CCA prox dias: 14 cm/s
Right cca dist sys: -112 cm/s

## 2018-11-16 ENCOUNTER — Other Ambulatory Visit: Payer: Self-pay | Admitting: Family Medicine

## 2018-11-16 DIAGNOSIS — Z1231 Encounter for screening mammogram for malignant neoplasm of breast: Secondary | ICD-10-CM

## 2018-12-23 ENCOUNTER — Ambulatory Visit
Admission: RE | Admit: 2018-12-23 | Discharge: 2018-12-23 | Disposition: A | Discharge status: Home / Self Care | Payer: Medicare Other | Source: Ambulatory Visit | Attending: Family Medicine | Admitting: Family Medicine

## 2018-12-23 DIAGNOSIS — Z1231 Encounter for screening mammogram for malignant neoplasm of breast: Secondary | ICD-10-CM

## 2019-02-05 ENCOUNTER — Other Ambulatory Visit: Payer: Self-pay

## 2019-02-05 DIAGNOSIS — R0989 Other specified symptoms and signs involving the circulatory and respiratory systems: Secondary | ICD-10-CM

## 2019-02-09 NOTE — Progress Notes (Signed)
HISTORY AND PHYSICAL     CC:  follow up. Requesting Provider:  London Pepper, MD  HPI: This is a 83 y.o. female here for follow up for carotid artery stenosis.  Pt is followed by Dr. Donnetta Hutching and was first seen for a carotid bruit.  Pt was last seen a year ago by NP.  At that time, she was asymptomatic.  On exam, she had prominent popliteal pulses.  She returns today for follow up.   She is doing well with no evidence of stroke or TIA as she denies amaurosis fugax, speech difficulties, weakness or hemiparesis.  She is very active.  She does not have any family hx of aneurysms that she is aware of.    The pt is on a statin for cholesterol management.  The pt is on a daily aspirin.   Other AC:  none The pt is  on CCB and ACEI for hypertension.   The pt is not diabetic.   Tobacco hx:  Non smoker   Past Medical History:  Diagnosis Date  . Diverticulosis   . Hypertension     Past Surgical History:  Procedure Laterality Date  . COLONOSCOPY  10/15/2011   Procedure: COLONOSCOPY;  Surgeon: Cleotis Nipper, MD;  Location: [facility];  Service: Endoscopy;  Laterality: N/A;  unprepped exam; pt is C diff + (contact precautions)  . TONSILLECTOMY      Allergies  Allergen Reactions  . Tetracyclines & Related     Stomach upset    Current Outpatient Medications  Medication Sig Dispense Refill  . amLODipine (NORVASC) 10 MG tablet Take 10 mg by mouth daily.     Marland Kitchen aspirin 81 MG tablet Take 81 mg by mouth daily.    Marland Kitchen atorvastatin (LIPITOR) 10 MG tablet Take 10 mg by mouth every evening.  3  . Cholecalciferol (VITAMIN D-3) 1000 units CAPS Take by mouth.    . hydrochlorothiazide (HYDRODIURIL) 25 MG tablet Take 25 mg by mouth daily.  3  . lisinopril (PRINIVIL,ZESTRIL) 40 MG tablet Take 40 mg by mouth daily.  3  . lisinopril-hydrochlorothiazide (PRINZIDE,ZESTORETIC) 20-12.5 MG per tablet Take 2 tablets by mouth daily.     . potassium chloride SA (K-DUR,KLOR-CON) 20 MEQ tablet Take 2 tablets (40  mEq total) by mouth daily. (Patient not taking: Reported on 02/03/2018) 24 tablet 0   No current facility-administered medications for this visit.     Family History  Problem Relation Age of Onset  . Hypertension Mother   . Heart disease Mother   . Hypertension Father   . Heart disease Son     Social History   Socioeconomic History  . Marital status: Divorced    Spouse name: Not on file  . Number of children: Not on file  . Years of education: Not on file  . Highest education level: Not on file  Occupational History  . Not on file  Social Needs  . Financial resource strain: Not on file  . Food insecurity:    Worry: Not on file    Inability: Not on file  . Transportation needs:    Medical: Not on file    Non-medical: Not on file  Tobacco Use  . Smoking status: Never Smoker  . Smokeless tobacco: Never Used  Substance and Sexual Activity  . Alcohol use: Yes    Comment: 1-2 drinks/day  . Drug use: No  . Sexual activity: Not on file  Lifestyle  . Physical activity:    Days per  week: Not on file    Minutes per session: Not on file  . Stress: Not on file  Relationships  . Social connections:    Talks on phone: Not on file    Gets together: Not on file    Attends religious service: Not on file    Active member of club or organization: Not on file    Attends meetings of clubs or organizations: Not on file    Relationship status: Not on file  . Intimate partner violence:    Fear of current or ex partner: Not on file    Emotionally abused: Not on file    Physically abused: Not on file    Forced sexual activity: Not on file  Other Topics Concern  . Not on file  Social History Narrative  . Not on file     REVIEW OF SYSTEMS:   [X]  denotes positive finding, [ ]  denotes negative finding Cardiac  Comments:  Chest pain or chest pressure:    Shortness of breath upon exertion:    Short of breath when lying flat:    Irregular heart rhythm:        Vascular    Pain  in calf, thigh, or hip brought on by ambulation:    Pain in feet at night that wakes you up from your sleep:     Blood clot in your veins:    Leg swelling:         Pulmonary    Oxygen at home:    Productive cough:     Wheezing:         Neurologic    Sudden weakness in arms or legs:     Sudden numbness in arms or legs:     Sudden onset of difficulty speaking or slurred speech:    Temporary loss of vision in one eye:     Problems with dizziness:         Gastrointestinal    Blood in stool:     Vomited blood:         Genitourinary    Burning when urinating:     Blood in urine:        Psychiatric    Major depression:         Hematologic    Bleeding problems:    Problems with blood clotting too easily:        Skin    Rashes or ulcers:        Constitutional    Fever or chills:      PHYSICAL EXAMINATION:  Today's Vitals   02/10/19 1412 02/10/19 1414  BP: (!) 143/80 (!) 142/80  Pulse: 71   Resp: 16   Temp: 98 F (36.7 C)   TempSrc: Oral   SpO2: 98%   Weight: 155 lb 10.3 oz (70.6 kg)   Height: 5\' 10"  (1.778 m)    Body mass index is 22.33 kg/m.   General:  WDWN in NAD; vital signs documented above Gait: Not observed HENT: WNL, normocephalic Pulmonary: normal non-labored breathing , without Rales, rhonchi,  wheezing Cardiac: regular HR, without  Murmurs, rubs or gallops; without carotid bruits Abdomen: soft, NT, aorta is palpable Skin: without rashes Vascular Exam/Pulses:  Right Left  Radial 2+ (normal) 2+ (normal)  Ulnar 2+ (normal) Unable to palpate   Popliteal Unable to palpate  Unable to palpate    Extremities: without ischemic changes, without Gangrene , without cellulitis; without open wounds;  Musculoskeletal: no muscle wasting or atrophy  Neurologic: A&O X 3 Psychiatric:  The pt has Normal affect.   Non-Invasive Vascular Imaging:   Carotid Duplex on 02/10/2019: Right:  1-39% ICA stenosis Left:  No evidence of ICA stenosis Vertebrals:   Bilateral vertebral arteries demonstrate antegrade flow. Subclavians: Normal flow hemodynamics were seen in bilateral subclavian              arteries.  Previous Carotid duplex on 02/03/18: No evidence of bilateral ICA stenosis  Arterial duplex 02/10/2019: No evidence of popliteal artery aneurysm bilaterally  ASSESSMENT/PLAN:: 83 y.o. female here for follow up carotid artery stenosis.  -pt doing well and is asymptomatic as far as carotid dz.  Her carotid duplex was 1-39% on the right and no evidence of stenosis on the left.  Will have her f/u in 2 years with carotid duplex.   -duplex reveals there is no evidence of popliteal aneurysms bilaterally -she does have a palpable aorta and this has not been evaluated.  Will plan for the medicare screen for AAA in the next couple of weeks.  -discussed s/s of stroke with pt and they understand should they develop any of these sx, pt will call [phone].   Leontine Locket, PA-C [facility] [phone]  Clinic MD:  Oneida Alar

## 2019-02-10 ENCOUNTER — Other Ambulatory Visit: Payer: Self-pay

## 2019-02-10 ENCOUNTER — Ambulatory Visit (HOSPITAL_COMMUNITY)
Admission: RE | Admit: 2019-02-10 | Discharge: 2019-02-10 | Disposition: A | Discharge status: Home / Self Care | Payer: Medicare Other | Source: Ambulatory Visit | Attending: Family | Admitting: Family

## 2019-02-10 ENCOUNTER — Ambulatory Visit: Payer: Medicare Other | Admitting: Physician Assistant

## 2019-02-10 ENCOUNTER — Encounter: Payer: Self-pay | Admitting: Physician Assistant

## 2019-02-10 ENCOUNTER — Other Ambulatory Visit: Payer: Self-pay | Admitting: Vascular Surgery

## 2019-02-10 ENCOUNTER — Ambulatory Visit (INDEPENDENT_AMBULATORY_CARE_PROVIDER_SITE_OTHER)
Admission: RE | Admit: 2019-02-10 | Discharge: 2019-02-10 | Disposition: A | Discharge status: Home / Self Care | Payer: Medicare Other | Source: Ambulatory Visit | Attending: Family | Admitting: Family

## 2019-02-10 VITALS — BP 142/80 | HR 71 | Temp 98.0°F | Resp 16 | Ht 70.0 in | Wt 155.6 lb

## 2019-02-10 DIAGNOSIS — I773 Arterial fibromuscular dysplasia: Secondary | ICD-10-CM

## 2019-02-10 DIAGNOSIS — R0989 Other specified symptoms and signs involving the circulatory and respiratory systems: Secondary | ICD-10-CM

## 2019-02-10 DIAGNOSIS — I6529 Occlusion and stenosis of unspecified carotid artery: Secondary | ICD-10-CM | POA: Diagnosis not present

## 2019-02-19 ENCOUNTER — Other Ambulatory Visit: Payer: Self-pay

## 2019-02-19 DIAGNOSIS — I773 Arterial fibromuscular dysplasia: Principal | ICD-10-CM

## 2019-02-19 DIAGNOSIS — R0989 Other specified symptoms and signs involving the circulatory and respiratory systems: Secondary | ICD-10-CM

## 2019-02-19 DIAGNOSIS — I6529 Occlusion and stenosis of unspecified carotid artery: Secondary | ICD-10-CM

## 2019-02-23 ENCOUNTER — Other Ambulatory Visit: Payer: Self-pay

## 2019-02-23 ENCOUNTER — Ambulatory Visit (HOSPITAL_COMMUNITY)
Admission: RE | Admit: 2019-02-23 | Discharge: 2019-02-23 | Disposition: A | Discharge status: Home / Self Care | Payer: Medicare Other | Source: Ambulatory Visit | Attending: Family | Admitting: Family

## 2019-02-23 DIAGNOSIS — I773 Arterial fibromuscular dysplasia: Secondary | ICD-10-CM

## 2019-02-23 DIAGNOSIS — Z136 Encounter for screening for cardiovascular disorders: Secondary | ICD-10-CM | POA: Diagnosis not present

## 2019-02-23 DIAGNOSIS — R0989 Other specified symptoms and signs involving the circulatory and respiratory systems: Secondary | ICD-10-CM

## 2019-11-15 ENCOUNTER — Other Ambulatory Visit: Payer: Self-pay | Admitting: Family Medicine

## 2019-11-15 DIAGNOSIS — Z1231 Encounter for screening mammogram for malignant neoplasm of breast: Secondary | ICD-10-CM

## 2020-01-04 ENCOUNTER — Ambulatory Visit
Admission: RE | Admit: 2020-01-04 | Discharge: 2020-01-04 | Disposition: A | Discharge status: Home / Self Care | Payer: Medicare Other | Source: Ambulatory Visit | Attending: Family Medicine | Admitting: Family Medicine

## 2020-01-04 ENCOUNTER — Other Ambulatory Visit: Payer: Self-pay

## 2020-01-04 DIAGNOSIS — Z1231 Encounter for screening mammogram for malignant neoplasm of breast: Secondary | ICD-10-CM

## 2020-12-07 ENCOUNTER — Other Ambulatory Visit: Payer: Self-pay | Admitting: Family Medicine

## 2020-12-07 DIAGNOSIS — Z1231 Encounter for screening mammogram for malignant neoplasm of breast: Secondary | ICD-10-CM

## 2021-01-08 ENCOUNTER — Other Ambulatory Visit: Payer: Self-pay | Admitting: *Deleted

## 2021-01-08 DIAGNOSIS — I6529 Occlusion and stenosis of unspecified carotid artery: Secondary | ICD-10-CM

## 2021-01-09 ENCOUNTER — Other Ambulatory Visit: Payer: Self-pay

## 2021-01-09 ENCOUNTER — Ambulatory Visit
Admission: RE | Admit: 2021-01-09 | Discharge: 2021-01-09 | Disposition: A | Discharge status: Home / Self Care | Payer: Medicare Other | Source: Ambulatory Visit | Attending: Family Medicine | Admitting: Family Medicine

## 2021-01-09 DIAGNOSIS — Z1231 Encounter for screening mammogram for malignant neoplasm of breast: Secondary | ICD-10-CM | POA: Diagnosis not present

## 2021-01-30 ENCOUNTER — Ambulatory Visit: Payer: Medicare Other | Admitting: Physician Assistant

## 2021-01-30 ENCOUNTER — Ambulatory Visit (HOSPITAL_COMMUNITY)
Admission: RE | Admit: 2021-01-30 | Discharge: 2021-01-30 | Disposition: A | Discharge status: Home / Self Care | Payer: Medicare Other | Source: Ambulatory Visit | Attending: Vascular Surgery | Admitting: Vascular Surgery

## 2021-01-30 ENCOUNTER — Other Ambulatory Visit: Payer: Self-pay

## 2021-01-30 VITALS — BP 143/80 | HR 76 | Temp 97.3°F | Resp 16 | Ht 70.0 in | Wt 150.0 lb

## 2021-01-30 DIAGNOSIS — I6529 Occlusion and stenosis of unspecified carotid artery: Secondary | ICD-10-CM

## 2021-01-30 NOTE — Progress Notes (Signed)
HISTORY AND PHYSICAL     CC:  follow up. Requesting Provider:  London Pepper, MD  HPI: This is a 85 y.o. female here for follow up for carotid artery stenosis.  Pt is followed by Dr. Donnetta Hutching and was first seen for a carotid bruit.  Pt was last seen March 2020 and at that time she remained asymptomatic.  She had prominent popliteal pulses and had aortic screening and popliteal duplex, which were both normal.    Pt returns today for follow up.    Pt denies any amaurosis fugax, speech difficulties, weakness, numbness, paralysis or clumsiness or facial droop.  She states she has been doing well since her last visit.   The pt is on a statin for cholesterol management.  The pt is on a daily aspirin.   Other AC:  none The pt is on ACEI and CCB for hypertension.   The pt is not diabetic.   Tobacco hx:  never   Past Medical History:  Diagnosis Date  . Diverticulosis   . Hypertension     Past Surgical History:  Procedure Laterality Date  . COLONOSCOPY  10/15/2011   Procedure: COLONOSCOPY;  Surgeon: Cleotis Nipper, MD;  Location: [facility];  Service: Endoscopy;  Laterality: N/A;  unprepped exam; pt is C diff + (contact precautions)  . TONSILLECTOMY      Allergies  Allergen Reactions  . Tetracyclines & Related     Stomach upset    Current Outpatient Medications  Medication Sig Dispense Refill  . amLODipine (NORVASC) 10 MG tablet Take 10 mg by mouth daily.     Marland Kitchen aspirin 81 MG tablet Take 81 mg by mouth daily.    Marland Kitchen atorvastatin (LIPITOR) 10 MG tablet Take 10 mg by mouth every evening.  3  . Cholecalciferol (VITAMIN D-3) 1000 units CAPS Take by mouth.    . hydrochlorothiazide (HYDRODIURIL) 25 MG tablet Take 25 mg by mouth daily.  3  . lisinopril (PRINIVIL,ZESTRIL) 40 MG tablet Take 40 mg by mouth daily.  3  . lisinopril-hydrochlorothiazide (PRINZIDE,ZESTORETIC) 20-12.5 MG per tablet Take 2 tablets by mouth daily.     . potassium chloride SA (K-DUR,KLOR-CON) 20 MEQ tablet Take 2  tablets (40 mEq total) by mouth daily. (Patient not taking: Reported on 02/03/2018) 24 tablet 0   No current facility-administered medications for this visit.    Family History  Problem Relation Age of Onset  . Hypertension Mother   . Heart disease Mother   . Hypertension Father   . Heart disease Son     Social History   Socioeconomic History  . Marital status: Divorced    Spouse name: Not on file  . Number of children: Not on file  . Years of education: Not on file  . Highest education level: Not on file  Occupational History  . Not on file  Tobacco Use  . Smoking status: Never Smoker  . Smokeless tobacco: Never Used  Vaping Use  . Vaping Use: Never used  Substance and Sexual Activity  . Alcohol use: Yes    Comment: 1-2 drinks/day  . Drug use: No  . Sexual activity: Not on file  Other Topics Concern  . Not on file  Social History Narrative  . Not on file   Social Determinants of Health   Financial Resource Strain: Not on file  Food Insecurity: Not on file  Transportation Needs: Not on file  Physical Activity: Not on file  Stress: Not on file  Social Connections:  Not on file  Intimate Partner Violence: Not on file     REVIEW OF SYSTEMS:   [X]  denotes positive finding, [ ]  denotes negative finding Cardiac  Comments:  Chest pain or chest pressure:    Shortness of breath upon exertion:    Short of breath when lying flat:    Irregular heart rhythm:        Vascular    Pain in calf, thigh, or hip brought on by ambulation:    Pain in feet at night that wakes you up from your sleep:     Blood clot in your veins:    Leg swelling:         Pulmonary    Oxygen at home:    Productive cough:     Wheezing:         Neurologic    Sudden weakness in arms or legs:     Sudden numbness in arms or legs:     Sudden onset of difficulty speaking or slurred speech:    Temporary loss of vision in one eye:     Problems with dizziness:         Gastrointestinal    Blood  in stool:     Vomited blood:         Genitourinary    Burning when urinating:     Blood in urine:        Psychiatric    Major depression:         Hematologic    Bleeding problems:    Problems with blood clotting too easily:        Skin    Rashes or ulcers:        Constitutional    Fever or chills:      PHYSICAL EXAMINATION:  Today's Vitals   01/30/21 1052 01/30/21 1056  BP: (!) 147/72 (!) 143/80  Pulse: 76 76  Resp: 16   Temp: (!) 97.3 F (36.3 C)   TempSrc: Temporal   SpO2: 100%   Weight: 150 lb (68 kg)   Height: 5\' 10"  (1.778 m)    Body mass index is 21.52 kg/m.   General:  WDWN in NAD; vital signs documented above Gait: Not observed HENT: WNL, normocephalic Pulmonary: normal non-labored breathing Cardiac: regular HR, without carotid bruits Abdomen: soft, NT, no masses; aortic pulse is not palpable Skin: without rashes Vascular Exam/Pulses:  Right Left  Radial 2+ (normal) 2+ (normal)  Popliteal Unable to palpate Unable to palpate  DP 2+ (normal) 2+ (normal)  PT 2+ (normal) 2+ (normal)   Extremities: without ischemic changes, without Gangrene , without cellulitis; without open wounds Musculoskeletal: no muscle wasting or atrophy  Neurologic: A&O X 3 Psychiatric:  The pt has Normal affect.   Non-Invasive Vascular Imaging:   Carotid Duplex on 01/30/2021: Right:  1-39% ICA stenosis proximal and low end of 40-59% distal ICA  Left:  1-39% ICA stenosis   Previous Carotid duplex on 02/10/2019: Right: 1-39% ICA stenosis Left:   normal    ASSESSMENT/PLAN:: 85 y.o. female here for follow up carotid artery stenosis.  -pt remains asymptomatic.  duplex today reveals 1-39% bilateral ICA stenosis with low end of 40-59% stenosis in the distal right ICA.   -discussed s/s of stroke with pt and she understands should she develop any of these sx, she will go to the nearest ER. -pt will f/u in one with carotid duplex given the small jump into the 40-59%  category. -continue statin/asa -pt will call  sooner should they have any issues.   Leontine Locket, [facility] [phone]  Clinic MD:  Stanford Breed

## 2021-02-01 DIAGNOSIS — H353132 Nonexudative age-related macular degeneration, bilateral, intermediate dry stage: Secondary | ICD-10-CM | POA: Diagnosis not present

## 2021-02-01 DIAGNOSIS — H5203 Hypermetropia, bilateral: Secondary | ICD-10-CM | POA: Diagnosis not present

## 2021-02-01 DIAGNOSIS — Z961 Presence of intraocular lens: Secondary | ICD-10-CM | POA: Diagnosis not present

## 2021-02-28 DIAGNOSIS — E559 Vitamin D deficiency, unspecified: Secondary | ICD-10-CM | POA: Diagnosis not present

## 2021-02-28 DIAGNOSIS — E785 Hyperlipidemia, unspecified: Secondary | ICD-10-CM | POA: Diagnosis not present

## 2021-02-28 DIAGNOSIS — I10 Essential (primary) hypertension: Secondary | ICD-10-CM | POA: Diagnosis not present

## 2021-02-28 DIAGNOSIS — I6529 Occlusion and stenosis of unspecified carotid artery: Secondary | ICD-10-CM | POA: Diagnosis not present

## 2021-02-28 DIAGNOSIS — R7309 Other abnormal glucose: Secondary | ICD-10-CM | POA: Diagnosis not present

## 2021-02-28 DIAGNOSIS — Z0000 Encounter for general adult medical examination without abnormal findings: Secondary | ICD-10-CM | POA: Diagnosis not present

## 2021-03-02 ENCOUNTER — Other Ambulatory Visit: Payer: Self-pay | Admitting: Family Medicine

## 2021-03-02 DIAGNOSIS — E2839 Other primary ovarian failure: Secondary | ICD-10-CM

## 2021-03-26 ENCOUNTER — Other Ambulatory Visit: Payer: Self-pay

## 2021-03-26 ENCOUNTER — Ambulatory Visit
Admission: RE | Admit: 2021-03-26 | Discharge: 2021-03-26 | Disposition: A | Discharge status: Home / Self Care | Payer: Medicare Other | Source: Ambulatory Visit | Attending: Family Medicine | Admitting: Family Medicine

## 2021-03-26 DIAGNOSIS — Z780 Asymptomatic menopausal state: Secondary | ICD-10-CM | POA: Diagnosis not present

## 2021-03-26 DIAGNOSIS — M8589 Other specified disorders of bone density and structure, multiple sites: Secondary | ICD-10-CM | POA: Diagnosis not present

## 2021-03-26 DIAGNOSIS — E2839 Other primary ovarian failure: Secondary | ICD-10-CM

## 2021-10-30 DIAGNOSIS — J0100 Acute maxillary sinusitis, unspecified: Secondary | ICD-10-CM | POA: Diagnosis not present

## 2021-12-12 ENCOUNTER — Other Ambulatory Visit: Payer: Self-pay | Admitting: Family Medicine

## 2021-12-12 DIAGNOSIS — Z1231 Encounter for screening mammogram for malignant neoplasm of breast: Secondary | ICD-10-CM

## 2022-01-10 ENCOUNTER — Ambulatory Visit
Admission: RE | Admit: 2022-01-10 | Discharge: 2022-01-10 | Disposition: A | Discharge status: Home / Self Care | Payer: Medicare Other | Source: Ambulatory Visit | Attending: Family Medicine | Admitting: Family Medicine

## 2022-01-10 DIAGNOSIS — Z1231 Encounter for screening mammogram for malignant neoplasm of breast: Secondary | ICD-10-CM

## 2022-02-04 ENCOUNTER — Other Ambulatory Visit: Payer: Self-pay

## 2022-02-04 DIAGNOSIS — R0989 Other specified symptoms and signs involving the circulatory and respiratory systems: Secondary | ICD-10-CM | POA: Insufficient documentation

## 2022-02-04 DIAGNOSIS — M8580 Other specified disorders of bone density and structure, unspecified site: Secondary | ICD-10-CM | POA: Insufficient documentation

## 2022-02-04 DIAGNOSIS — Z8719 Personal history of other diseases of the digestive system: Secondary | ICD-10-CM | POA: Insufficient documentation

## 2022-02-04 DIAGNOSIS — E559 Vitamin D deficiency, unspecified: Secondary | ICD-10-CM | POA: Insufficient documentation

## 2022-02-04 DIAGNOSIS — Z860100 Personal history of colon polyps, unspecified: Secondary | ICD-10-CM | POA: Insufficient documentation

## 2022-02-04 DIAGNOSIS — I6529 Occlusion and stenosis of unspecified carotid artery: Secondary | ICD-10-CM | POA: Insufficient documentation

## 2022-02-04 DIAGNOSIS — E785 Hyperlipidemia, unspecified: Secondary | ICD-10-CM | POA: Insufficient documentation

## 2022-02-04 DIAGNOSIS — I773 Arterial fibromuscular dysplasia: Secondary | ICD-10-CM | POA: Insufficient documentation

## 2022-02-07 DIAGNOSIS — H5203 Hypermetropia, bilateral: Secondary | ICD-10-CM | POA: Diagnosis not present

## 2022-02-07 DIAGNOSIS — Z961 Presence of intraocular lens: Secondary | ICD-10-CM | POA: Diagnosis not present

## 2022-02-07 DIAGNOSIS — H353132 Nonexudative age-related macular degeneration, bilateral, intermediate dry stage: Secondary | ICD-10-CM | POA: Diagnosis not present

## 2022-02-12 ENCOUNTER — Ambulatory Visit (HOSPITAL_COMMUNITY)
Admission: RE | Admit: 2022-02-12 | Discharge: 2022-02-12 | Disposition: A | Discharge status: Home / Self Care | Payer: Medicare Other | Source: Ambulatory Visit | Attending: Vascular Surgery | Admitting: Vascular Surgery

## 2022-02-12 ENCOUNTER — Ambulatory Visit: Payer: Medicare Other | Admitting: Physician Assistant

## 2022-02-12 ENCOUNTER — Other Ambulatory Visit: Payer: Self-pay

## 2022-02-12 VITALS — BP 136/77 | HR 80 | Temp 98.2°F | Resp 18 | Ht 69.0 in | Wt 147.0 lb

## 2022-02-12 DIAGNOSIS — I6529 Occlusion and stenosis of unspecified carotid artery: Secondary | ICD-10-CM | POA: Insufficient documentation

## 2022-02-12 NOTE — Progress Notes (Signed)
?Office Note  ? ? ? ?CC:  follow up ?Requesting Provider:  London Pepper, MD ? ?HPI: Joan Cook is a 86 y.o. (12/22/1935) female who presents for surveillance of carotid artery stenosis.  She denies any CVA or TIA since last office visit 1 year ago.  She also denies any current strokelike symptoms including slurring speech, changes in vision, or one-sided weakness.  She does have left lateral vision deficit however she attributes this to macular degeneration.  This has forced her to stop playing tennis.  She however is very active and walks every day.  She is on aspirin and a statin daily.  She denies tobacco use. ? ? ?Past Medical History:  ?Diagnosis Date  ? Diverticulosis   ? Hypertension   ? ? ?Past Surgical History:  ?Procedure Laterality Date  ? COLONOSCOPY  10/15/2011  ? Procedure: COLONOSCOPY;  Surgeon: Cleotis Nipper, MD;  Location: [facility];  Service: Endoscopy;  Laterality: N/A;  unprepped exam; pt is C diff + (contact precautions)  ? TONSILLECTOMY    ? ? ?Social History  ? ?Socioeconomic History  ? Marital status: Divorced  ?  Spouse name: Not on file  ? Number of children: Not on file  ? Years of education: Not on file  ? Highest education level: Not on file  ?Occupational History  ? Not on file  ?Tobacco Use  ? Smoking status: Never  ? Smokeless tobacco: Never  ?Vaping Use  ? Vaping Use: Never used  ?Substance and Sexual Activity  ? Alcohol use: Yes  ?  Comment: 1-2 drinks/day  ? Drug use: No  ? Sexual activity: Not on file  ?Other Topics Concern  ? Not on file  ?Social History Narrative  ? Not on file  ? ?Social Determinants of Health  ? ?Financial Resource Strain: Not on file  ?Food Insecurity: Not on file  ?Transportation Needs: Not on file  ?Physical Activity: Not on file  ?Stress: Not on file  ?Social Connections: Not on file  ?Intimate Partner Violence: Not on file  ? ? ?Family History  ?Problem Relation Age of Onset  ? Hypertension Mother   ? Heart disease Mother   ? Hypertension Father    ? Heart disease Son   ? ? ?Current Outpatient Medications  ?Medication Sig Dispense Refill  ? amLODipine (NORVASC) 10 MG tablet Take 10 mg by mouth daily.    ? aspirin 81 MG tablet Take 81 mg by mouth daily.    ? atorvastatin (LIPITOR) 10 MG tablet Take 10 mg by mouth every evening.  3  ? Cholecalciferol (VITAMIN D-3) 1000 units CAPS Take by mouth.    ? hydrochlorothiazide (HYDRODIURIL) 25 MG tablet Take 25 mg by mouth daily.  3  ? lisinopril (PRINIVIL,ZESTRIL) 40 MG tablet Take 40 mg by mouth daily.  3  ? Multiple Vitamins-Minerals (PRESERVISION AREDS 2 PO) Take by mouth 2 (two) times daily.    ? lisinopril-hydrochlorothiazide (PRINZIDE,ZESTORETIC) 20-12.5 MG per tablet Take 2 tablets by mouth daily.    ? potassium chloride SA (K-DUR,KLOR-CON) 20 MEQ tablet Take 2 tablets (40 mEq total) by mouth daily. (Patient not taking: Reported on 02/03/2018) 24 tablet 0  ? ?No current facility-administered medications for this visit.  ? ? ?Allergies  ?Allergen Reactions  ? Tetracycline Hcl Other (See Comments)  ? Tetracyclines & Related   ?  Stomach upset  ? ? ? ?REVIEW OF SYSTEMS:  ? ?'[X]'$  denotes positive finding, '[ ]'$  denotes negative finding ?Cardiac  Comments:  ?  Chest pain or chest pressure:    ?Shortness of breath upon exertion:    ?Short of breath when lying flat:    ?Irregular heart rhythm:    ?    ?Vascular    ?Pain in calf, thigh, or hip brought on by ambulation:    ?Pain in feet at night that wakes you up from your sleep:     ?Blood clot in your veins:    ?Leg swelling:     ?    ?Pulmonary    ?Oxygen at home:    ?Productive cough:     ?Wheezing:     ?    ?Neurologic    ?Sudden weakness in arms or legs:     ?Sudden numbness in arms or legs:     ?Sudden onset of difficulty speaking or slurred speech:    ?Temporary loss of vision in one eye:     ?Problems with dizziness:     ?    ?Gastrointestinal    ?Blood in stool:     ?Vomited blood:     ?    ?Genitourinary    ?Burning when urinating:     ?Blood in urine:    ?     ?Psychiatric    ?Major depression:     ?    ?Hematologic    ?Bleeding problems:    ?Problems with blood clotting too easily:    ?    ?Skin    ?Rashes or ulcers:    ?    ?Constitutional    ?Fever or chills:    ? ? ?PHYSICAL EXAMINATION: ? ?Vitals:  ? 02/12/22 1352 02/12/22 1357  ?BP: (!) 142/75 136/77  ?Pulse: 80   ?Resp: 18   ?Temp: 98.2 ?F (36.8 ?C)   ?TempSrc: Temporal   ?SpO2: 97%   ?Weight: 147 lb (66.7 kg)   ?Height: '5\' 9"'$  (1.753 m)   ? ? ?General:  WDWN in NAD; vital signs documented above ?Gait: Not observed ?HENT: WNL, normocephalic ?Pulmonary: normal non-labored breathing , without Rales, rhonchi,  wheezing ?Cardiac: regular HR ?Abdomen: soft, NT, no masses ?Skin: without rashes ?Vascular Exam/Pulses: ? Right Left  ?Radial 2+ (normal) 2+ (normal)  ? ?Extremities: without ischemic changes, without Gangrene , without cellulitis; without open wounds;  ?Musculoskeletal: no muscle wasting or atrophy  ?Neurologic: A&O X 3;  CN grossly intact ?Psychiatric:  The pt has Normal affect. ? ? ?Non-Invasive Vascular Imaging:   ? ?Bilateral ICA 1 to 39% stenosis ? ? ?ASSESSMENT/PLAN:: 86 y.o. female here for follow up for surveillance of carotid artery stenosis ? ?-Subjectively patient has not had any neurological symptoms since last office visit 1 year ago ?-Carotid duplex demonstrates 1 to 39% stenosis of bilateral internal carotid arteries; no indication for further work-up or intervention at this time ?-Recheck carotid duplex in 1 year ?-Continue aspirin and statin daily ? ? ?Dagoberto Ligas, PA-C ?[facility] ?[phone] ? ?Clinic MD:   Carlis Abbott ? ?

## 2022-02-25 DIAGNOSIS — H35033 Hypertensive retinopathy, bilateral: Secondary | ICD-10-CM | POA: Diagnosis not present

## 2022-02-25 DIAGNOSIS — H43813 Vitreous degeneration, bilateral: Secondary | ICD-10-CM | POA: Diagnosis not present

## 2022-02-25 DIAGNOSIS — H353113 Nonexudative age-related macular degeneration, right eye, advanced atrophic without subfoveal involvement: Secondary | ICD-10-CM | POA: Diagnosis not present

## 2022-02-25 DIAGNOSIS — H353124 Nonexudative age-related macular degeneration, left eye, advanced atrophic with subfoveal involvement: Secondary | ICD-10-CM | POA: Diagnosis not present

## 2022-04-01 DIAGNOSIS — Z0000 Encounter for general adult medical examination without abnormal findings: Secondary | ICD-10-CM | POA: Diagnosis not present

## 2022-04-01 DIAGNOSIS — I10 Essential (primary) hypertension: Secondary | ICD-10-CM | POA: Diagnosis not present

## 2022-04-01 DIAGNOSIS — R7309 Other abnormal glucose: Secondary | ICD-10-CM | POA: Diagnosis not present

## 2022-04-01 DIAGNOSIS — E559 Vitamin D deficiency, unspecified: Secondary | ICD-10-CM | POA: Diagnosis not present

## 2022-04-01 DIAGNOSIS — I6529 Occlusion and stenosis of unspecified carotid artery: Secondary | ICD-10-CM | POA: Diagnosis not present

## 2022-04-01 DIAGNOSIS — E785 Hyperlipidemia, unspecified: Secondary | ICD-10-CM | POA: Diagnosis not present

## 2022-08-26 DIAGNOSIS — H353124 Nonexudative age-related macular degeneration, left eye, advanced atrophic with subfoveal involvement: Secondary | ICD-10-CM | POA: Diagnosis not present

## 2022-08-26 DIAGNOSIS — H35033 Hypertensive retinopathy, bilateral: Secondary | ICD-10-CM | POA: Diagnosis not present

## 2022-08-26 DIAGNOSIS — H43813 Vitreous degeneration, bilateral: Secondary | ICD-10-CM | POA: Diagnosis not present

## 2022-08-26 DIAGNOSIS — H353113 Nonexudative age-related macular degeneration, right eye, advanced atrophic without subfoveal involvement: Secondary | ICD-10-CM | POA: Diagnosis not present

## 2022-12-12 ENCOUNTER — Other Ambulatory Visit: Payer: Self-pay | Admitting: Family Medicine

## 2022-12-12 DIAGNOSIS — Z1231 Encounter for screening mammogram for malignant neoplasm of breast: Secondary | ICD-10-CM

## 2023-01-31 ENCOUNTER — Ambulatory Visit
Admission: RE | Admit: 2023-01-31 | Discharge: 2023-01-31 | Disposition: A | Discharge status: Home / Self Care | Payer: Medicare Other | Source: Ambulatory Visit | Attending: Family Medicine | Admitting: Family Medicine

## 2023-01-31 DIAGNOSIS — Z1231 Encounter for screening mammogram for malignant neoplasm of breast: Secondary | ICD-10-CM

## 2023-02-10 DIAGNOSIS — H353133 Nonexudative age-related macular degeneration, bilateral, advanced atrophic without subfoveal involvement: Secondary | ICD-10-CM | POA: Diagnosis not present

## 2023-02-10 DIAGNOSIS — H52203 Unspecified astigmatism, bilateral: Secondary | ICD-10-CM | POA: Diagnosis not present

## 2023-02-10 DIAGNOSIS — Z961 Presence of intraocular lens: Secondary | ICD-10-CM | POA: Diagnosis not present

## 2023-02-24 DIAGNOSIS — H43813 Vitreous degeneration, bilateral: Secondary | ICD-10-CM | POA: Diagnosis not present

## 2023-02-24 DIAGNOSIS — H353133 Nonexudative age-related macular degeneration, bilateral, advanced atrophic without subfoveal involvement: Secondary | ICD-10-CM | POA: Diagnosis not present

## 2023-02-24 DIAGNOSIS — H35033 Hypertensive retinopathy, bilateral: Secondary | ICD-10-CM | POA: Diagnosis not present

## 2023-03-11 ENCOUNTER — Other Ambulatory Visit: Payer: Self-pay | Admitting: *Deleted

## 2023-03-11 DIAGNOSIS — I6529 Occlusion and stenosis of unspecified carotid artery: Secondary | ICD-10-CM

## 2023-03-18 ENCOUNTER — Ambulatory Visit: Payer: Medicare Other | Admitting: Physician Assistant

## 2023-03-18 ENCOUNTER — Ambulatory Visit (HOSPITAL_COMMUNITY)
Admission: RE | Admit: 2023-03-18 | Discharge: 2023-03-18 | Disposition: A | Discharge status: Home / Self Care | Payer: Medicare Other | Source: Ambulatory Visit | Attending: Vascular Surgery | Admitting: Vascular Surgery

## 2023-03-18 VITALS — BP 126/61 | HR 68 | Temp 97.6°F | Resp 18 | Ht 69.0 in | Wt 145.8 lb

## 2023-03-18 DIAGNOSIS — I6529 Occlusion and stenosis of unspecified carotid artery: Secondary | ICD-10-CM | POA: Diagnosis not present

## 2023-03-18 NOTE — Progress Notes (Signed)
Established Carotid Patient   History of Present Illness   Joan Cook is a 87 y.o. (10-18-1936) female who presents for surveillance of carotid artery stenosis.  She has no prior history of CVA or TIA.  She returns today for follow-up.  She denies any strokelike symptoms such as sudden changes in vision, sudden one-sided weakness/numbness, slurred speech, or facial droop.  She does have a history of macular degeneration with left lateral vision deficit which is currently stable. Unfortunately this has forced her to stop playing tennis, but she stays active walking. She denies any claudication, rest pain, or tissue loss.   She is on aspirin and statin.  Current Outpatient Medications  Medication Sig Dispense Refill   amLODipine (NORVASC) 10 MG tablet Take 10 mg by mouth daily.     aspirin 81 MG tablet Take 81 mg by mouth daily.     atorvastatin (LIPITOR) 10 MG tablet Take 10 mg by mouth every evening.  3   Cholecalciferol (VITAMIN D-3) 1000 units CAPS Take by mouth.     hydrochlorothiazide (HYDRODIURIL) 25 MG tablet Take 25 mg by mouth daily.  3   lisinopril (PRINIVIL,ZESTRIL) 40 MG tablet Take 40 mg by mouth daily.  3   Multiple Vitamins-Minerals (PRESERVISION AREDS 2 PO) Take by mouth 2 (two) times daily.     lisinopril-hydrochlorothiazide (PRINZIDE,ZESTORETIC) 20-12.5 MG per tablet Take 2 tablets by mouth daily.     potassium chloride SA (K-DUR,KLOR-CON) 20 MEQ tablet Take 2 tablets (40 mEq total) by mouth daily. (Patient not taking: Reported on 02/03/2018) 24 tablet 0   No current facility-administered medications for this visit.    REVIEW OF SYSTEMS (negative unless checked):   Cardiac:  []  Chest pain or chest pressure? []  Shortness of breath upon activity? []  Shortness of breath when lying flat? []  Irregular heart rhythm?  Vascular:  []  Pain in calf, thigh, or hip brought on by walking? []  Pain in feet at night that wakes you up from your sleep? []  Blood clot in your  veins? []  Leg swelling?  Pulmonary:  []  Oxygen at home? []  Productive cough? []  Wheezing?  Neurologic:  []  Sudden weakness in arms or legs? []  Sudden numbness in arms or legs? []  Sudden onset of difficult speaking or slurred speech? []  Temporary loss of vision in one eye? []  Problems with dizziness?  Gastrointestinal:  []  Blood in stool? []  Vomited blood?  Genitourinary:  []  Burning when urinating? []  Blood in urine?  Psychiatric:  []  Major depression  Hematologic:  []  Bleeding problems? []  Problems with blood clotting?  Dermatologic:  []  Rashes or ulcers?  Constitutional:  []  Fever or chills?  Ear/Nose/Throat:  []  Change in hearing? []  Nose bleeds? []  Sore throat?  Musculoskeletal:  []  Back pain? []  Joint pain? []  Muscle pain?   Physical Examination   Vitals:   03/18/23 1357 03/18/23 1400  BP: 133/62 126/61  Pulse: 68   Resp: 18   Temp: 97.6 F (36.4 C)   TempSrc: Temporal   SpO2: 99%   Weight: 145 lb 12.8 oz (66.1 kg)   Height: 5\' 9"  (1.753 m)    Body mass index is 21.53 kg/m.  General:  WDWN in NAD; vital signs documented above Gait: Not observed HENT: WNL, normocephalic Pulmonary: normal non-labored breathing , without Rales, rhonchi,  wheezing Cardiac:  regular Abdomen: soft, NT, no masses Skin: without rashes Vascular Exam/Pulses: palpable and equal radial pulses 2+ Extremities: without ischemic changes, without Gangrene , without cellulitis; without  open wounds;  Musculoskeletal: no muscle wasting or atrophy  Neurologic: A&O X 3;  No focal weakness or paresthesias are detected Psychiatric:  The pt has Normal affect.  Non-Invasive Vascular Imaging   B Carotid Duplex (03/18/2023):  R ICA stenosis:  1-39% R VA:  patent and antegrade L ICA stenosis:  1-39% L VA:  patent and antegrade   Medical Decision Making   Joan Cook is a 87 y.o. female who presents for surveillance of carotid artery stenosis  Based on the patient's  vascular studies, her bilateral ICA stenosis remains unchanged.  Her right ICA stenosis is borderline 40% stenosis and the left ICA is 1-39%. She denies any symptoms of stroke such as sudden changes to vision, slurred speech, weakness/numbness. On exam she has palpable and equal radial pulses. She can continue her aspirin and statin and follow up with our office in 1 year with carotid duplex   Loel Dubonnet PA-C [facility] [place] Office: [phone]  Clinic MD: Steve Rattler

## 2023-04-09 ENCOUNTER — Other Ambulatory Visit (HOSPITAL_BASED_OUTPATIENT_CLINIC_OR_DEPARTMENT_OTHER): Payer: Self-pay

## 2023-04-09 ENCOUNTER — Other Ambulatory Visit: Payer: Self-pay

## 2023-04-11 DIAGNOSIS — E559 Vitamin D deficiency, unspecified: Secondary | ICD-10-CM | POA: Diagnosis not present

## 2023-04-11 DIAGNOSIS — H3530 Unspecified macular degeneration: Secondary | ICD-10-CM | POA: Diagnosis not present

## 2023-04-11 DIAGNOSIS — M8580 Other specified disorders of bone density and structure, unspecified site: Secondary | ICD-10-CM | POA: Diagnosis not present

## 2023-04-11 DIAGNOSIS — R7309 Other abnormal glucose: Secondary | ICD-10-CM | POA: Diagnosis not present

## 2023-04-11 DIAGNOSIS — H9190 Unspecified hearing loss, unspecified ear: Secondary | ICD-10-CM | POA: Diagnosis not present

## 2023-04-11 DIAGNOSIS — I6529 Occlusion and stenosis of unspecified carotid artery: Secondary | ICD-10-CM | POA: Diagnosis not present

## 2023-04-11 DIAGNOSIS — Z0000 Encounter for general adult medical examination without abnormal findings: Secondary | ICD-10-CM | POA: Diagnosis not present

## 2023-04-11 DIAGNOSIS — E785 Hyperlipidemia, unspecified: Secondary | ICD-10-CM | POA: Diagnosis not present

## 2023-04-11 DIAGNOSIS — I10 Essential (primary) hypertension: Secondary | ICD-10-CM | POA: Diagnosis not present

## 2023-04-14 ENCOUNTER — Other Ambulatory Visit: Payer: Self-pay | Admitting: Family Medicine

## 2023-04-14 DIAGNOSIS — M8580 Other specified disorders of bone density and structure, unspecified site: Secondary | ICD-10-CM

## 2023-05-26 IMAGING — MG MM DIGITAL SCREENING BILAT W/ TOMO AND CAD
8 series · 9 of 24 positions shown · non-contrast
Comparison: Previous exam(s).

CLINICAL DATA: Screening.

EXAM:
DIGITAL SCREENING BILATERAL MAMMOGRAM WITH TOMOSYNTHESIS AND CAD
TECHNIQUE: Bilateral screening digital craniocaudal and mediolateral oblique
mammograms were obtained. Bilateral screening digital breast
tomosynthesis was performed. The images were evaluated with
computer-aided detection.

[R CC synth-2D]
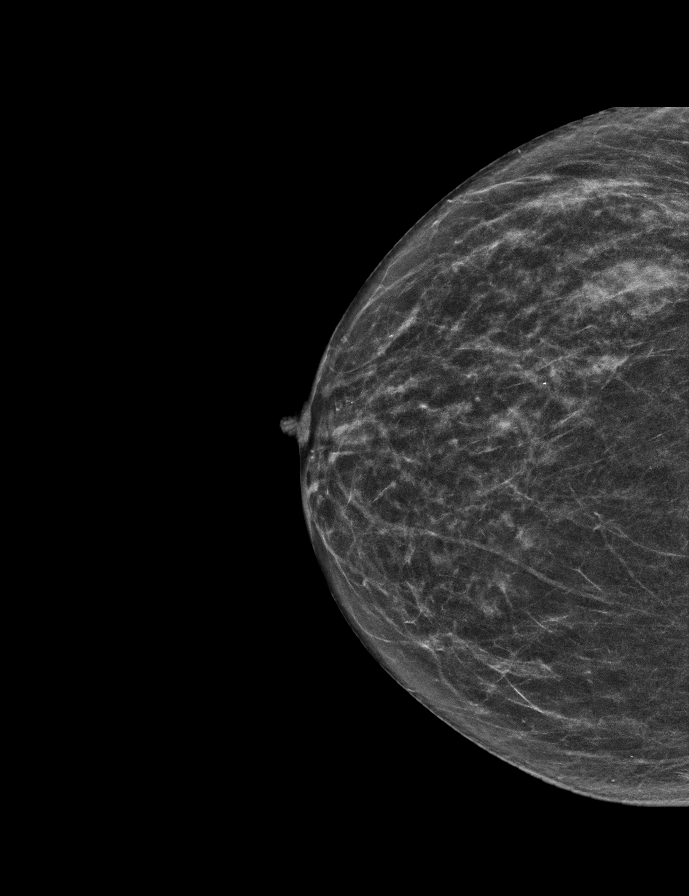

[R MLO synth-2D]
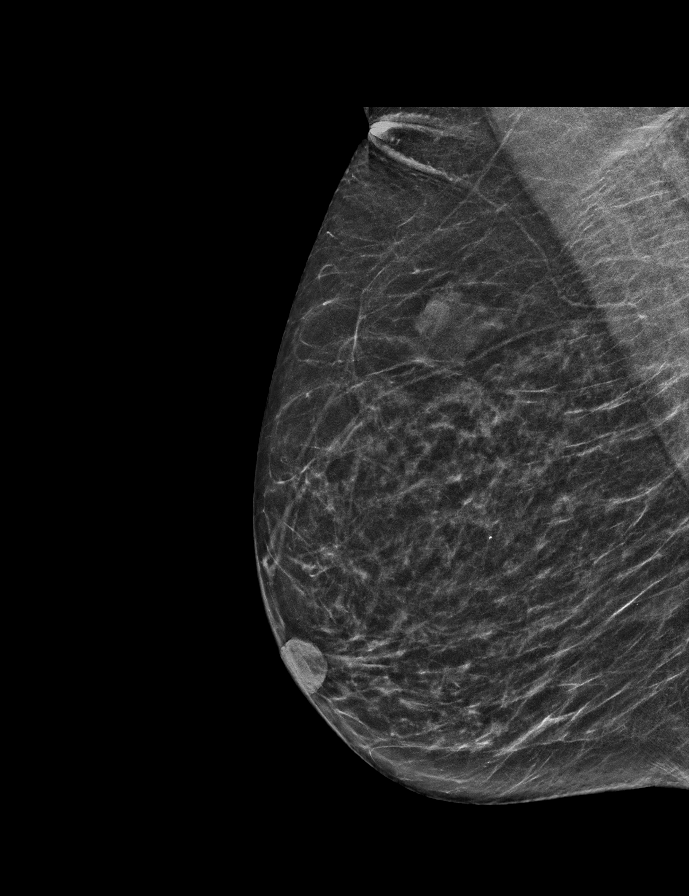

[L CC synth-2D]
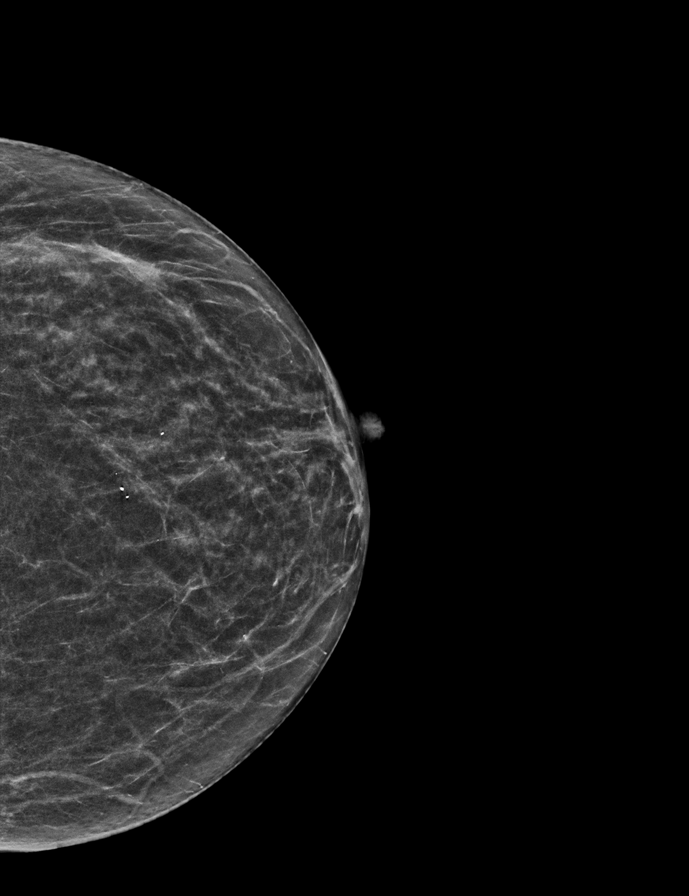

[L MLO synth-2D]
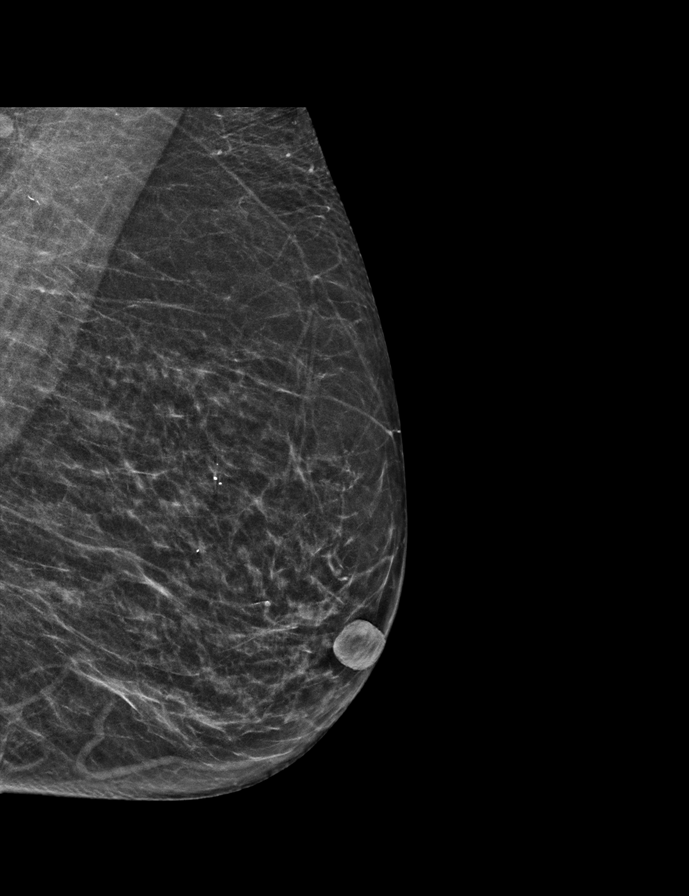

[R CC tomo · 2 of 51 frames shown]
[frame 17/51]
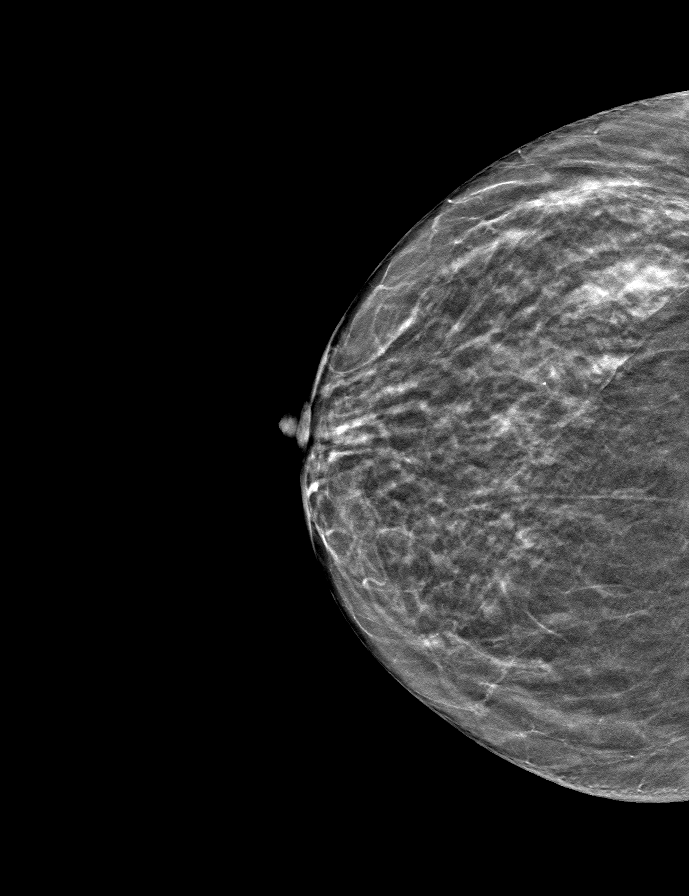
[frame 26/51]
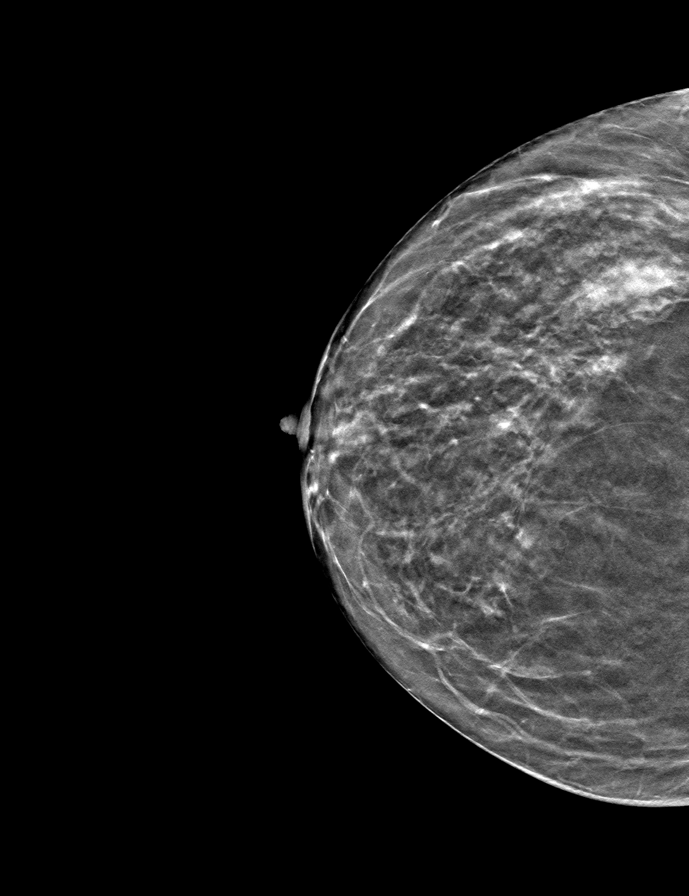

[R MLO tomo · tomo slice 26/51.0]
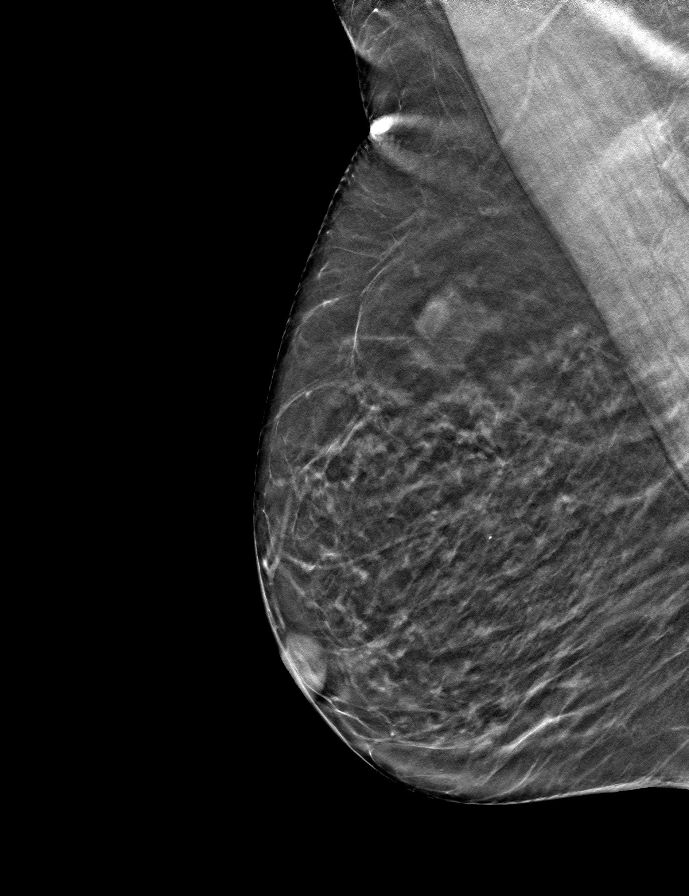

[L MLO tomo · tomo slice 26/51.0]
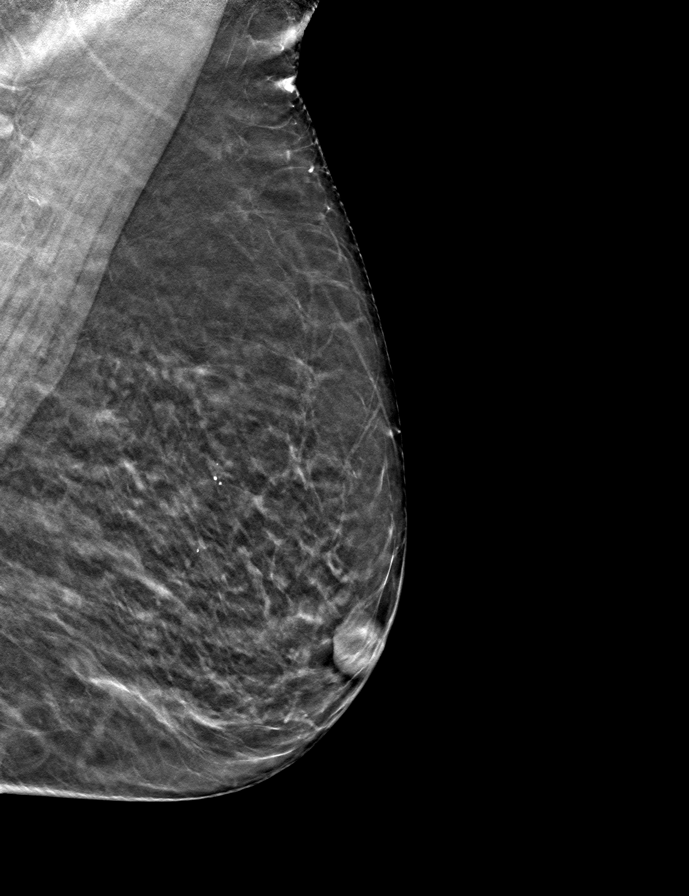

[L CC tomo · tomo slice 25/50.0]
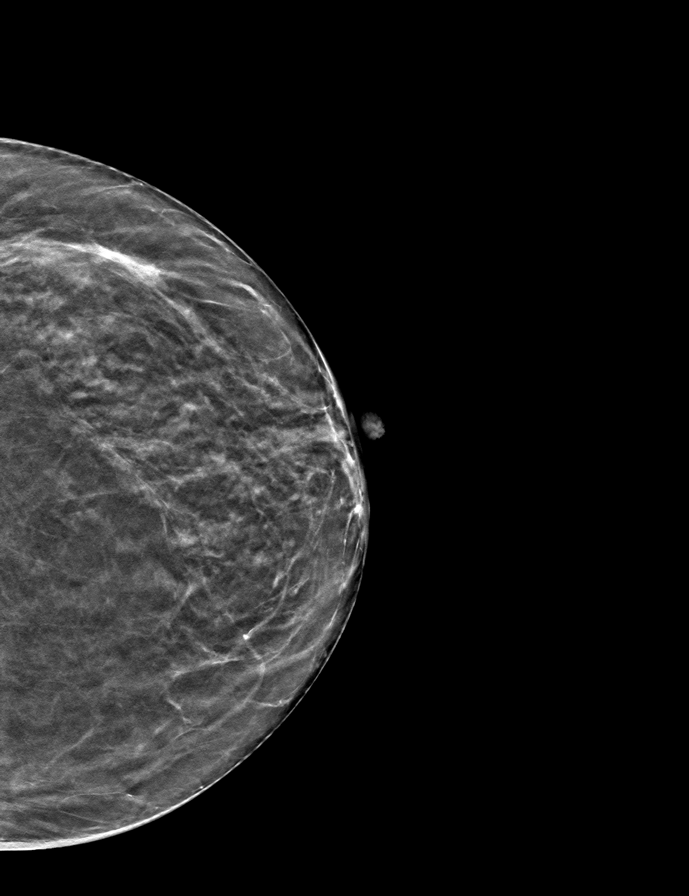

[9 of 24 positions shown; findings below may reference images not displayed]

ACR Breast Density Category b: There are scattered areas of
fibroglandular density.
FINDINGS: There are no findings suspicious for malignancy.
IMPRESSION: No mammographic evidence of malignancy. A result letter of this
screening mammogram will be mailed directly to the patient.

RECOMMENDATION:
Screening mammogram in one year. (Code:51-O-LD2)

BI-RADS CATEGORY  1: Negative.

## 2023-09-01 DIAGNOSIS — H353133 Nonexudative age-related macular degeneration, bilateral, advanced atrophic without subfoveal involvement: Secondary | ICD-10-CM | POA: Diagnosis not present

## 2023-09-01 DIAGNOSIS — H43813 Vitreous degeneration, bilateral: Secondary | ICD-10-CM | POA: Diagnosis not present

## 2023-09-01 DIAGNOSIS — H35033 Hypertensive retinopathy, bilateral: Secondary | ICD-10-CM | POA: Diagnosis not present

## 2023-09-01 DIAGNOSIS — Z961 Presence of intraocular lens: Secondary | ICD-10-CM | POA: Diagnosis not present

## 2023-11-13 ENCOUNTER — Ambulatory Visit
Admission: RE | Admit: 2023-11-13 | Discharge: 2023-11-13 | Disposition: A | Discharge status: Home / Self Care | Payer: Medicare Other | Source: Ambulatory Visit | Attending: Family Medicine | Admitting: Family Medicine

## 2023-11-13 DIAGNOSIS — M8580 Other specified disorders of bone density and structure, unspecified site: Secondary | ICD-10-CM

## 2023-11-13 DIAGNOSIS — M8588 Other specified disorders of bone density and structure, other site: Secondary | ICD-10-CM | POA: Diagnosis not present

## 2023-12-12 DIAGNOSIS — M8580 Other specified disorders of bone density and structure, unspecified site: Secondary | ICD-10-CM | POA: Diagnosis not present

## 2023-12-22 ENCOUNTER — Other Ambulatory Visit: Payer: Self-pay | Admitting: Family Medicine

## 2023-12-22 DIAGNOSIS — Z1231 Encounter for screening mammogram for malignant neoplasm of breast: Secondary | ICD-10-CM

## 2024-02-02 ENCOUNTER — Ambulatory Visit
Admission: RE | Admit: 2024-02-02 | Discharge: 2024-02-02 | Disposition: A | Discharge status: Home / Self Care | Payer: Medicare Other | Source: Ambulatory Visit | Attending: Family Medicine | Admitting: Family Medicine

## 2024-02-02 DIAGNOSIS — Z1231 Encounter for screening mammogram for malignant neoplasm of breast: Secondary | ICD-10-CM | POA: Diagnosis not present

## 2024-02-12 DIAGNOSIS — Z961 Presence of intraocular lens: Secondary | ICD-10-CM | POA: Diagnosis not present

## 2024-02-12 DIAGNOSIS — H353133 Nonexudative age-related macular degeneration, bilateral, advanced atrophic without subfoveal involvement: Secondary | ICD-10-CM | POA: Diagnosis not present

## 2024-02-12 DIAGNOSIS — H52201 Unspecified astigmatism, right eye: Secondary | ICD-10-CM | POA: Diagnosis not present

## 2024-03-15 DIAGNOSIS — H353124 Nonexudative age-related macular degeneration, left eye, advanced atrophic with subfoveal involvement: Secondary | ICD-10-CM | POA: Diagnosis not present

## 2024-03-15 DIAGNOSIS — H353113 Nonexudative age-related macular degeneration, right eye, advanced atrophic without subfoveal involvement: Secondary | ICD-10-CM | POA: Diagnosis not present

## 2024-03-15 DIAGNOSIS — H35033 Hypertensive retinopathy, bilateral: Secondary | ICD-10-CM | POA: Diagnosis not present

## 2024-03-15 DIAGNOSIS — Z961 Presence of intraocular lens: Secondary | ICD-10-CM | POA: Diagnosis not present

## 2024-03-15 DIAGNOSIS — H43813 Vitreous degeneration, bilateral: Secondary | ICD-10-CM | POA: Diagnosis not present

## 2024-03-25 ENCOUNTER — Other Ambulatory Visit: Payer: Self-pay | Admitting: *Deleted

## 2024-03-25 DIAGNOSIS — I6529 Occlusion and stenosis of unspecified carotid artery: Secondary | ICD-10-CM

## 2024-04-06 ENCOUNTER — Ambulatory Visit (HOSPITAL_COMMUNITY)
Admission: RE | Admit: 2024-04-06 | Discharge: 2024-04-06 | Disposition: A | Discharge status: Home / Self Care | Payer: Medicare Other | Source: Ambulatory Visit | Attending: Vascular Surgery | Admitting: Vascular Surgery

## 2024-04-06 ENCOUNTER — Ambulatory Visit: Payer: Medicare Other | Attending: Vascular Surgery | Admitting: Physician Assistant

## 2024-04-06 VITALS — BP 147/76 | HR 64 | Temp 98.4°F | Resp 18 | Ht 69.0 in | Wt 137.4 lb

## 2024-04-06 DIAGNOSIS — I6529 Occlusion and stenosis of unspecified carotid artery: Secondary | ICD-10-CM | POA: Diagnosis not present

## 2024-04-06 NOTE — Progress Notes (Signed)
 History of Present Illness:  Patient is a 88 y.o. year old female who presents for evaluation of carotid stenosis.  The patient denies symptoms of TIA, amaurosis, or stroke.  She denies amaurosis, weakness on one side of the body or aphasia.  The patient is currently on ASA antiplatelet therapy.  She stays active by walk > mile daily and eating healthy.  She was last seen by our office a year ago.  Her carotid duplex showed < 39% stenosis in B ICAs.  She is medically managed with a daily Statin.       Past Medical History:  Diagnosis Date   Diverticulosis    Hypertension     Past Surgical History:  Procedure Laterality Date   COLONOSCOPY  10/15/2011   Procedure: COLONOSCOPY;  Surgeon: Brice Campi, MD;  Location: [facility];  Service: Endoscopy;  Laterality: N/A;  unprepped exam; pt is C diff + (contact precautions)   TONSILLECTOMY       Social History Social History   Tobacco Use   Smoking status: Never   Smokeless tobacco: Never  Vaping Use   Vaping status: Never Used  Substance Use Topics   Alcohol use: Yes    Comment: 1-2 drinks/day   Drug use: No    Family History Family History  Problem Relation Age of Onset   Hypertension Mother    Heart disease Mother    Hypertension Father    Heart disease Son     Allergies  Allergies  Allergen Reactions   Tetracycline Hcl Other (See Comments)   Tetracyclines & Related     Stomach upset     Current Outpatient Medications  Medication Sig Dispense Refill   alendronate (FOSAMAX) 70 MG tablet Take 70 mg by mouth once a week. Take with a full glass of water on an empty stomach.     amLODipine  (NORVASC ) 10 MG tablet Take 10 mg by mouth daily.     aspirin 81 MG tablet Take 81 mg by mouth daily.     atorvastatin (LIPITOR) 10 MG tablet Take 10 mg by mouth every evening.  3   Cholecalciferol (VITAMIN D-3) 1000 units CAPS Take by mouth.     hydrochlorothiazide  (HYDRODIURIL ) 25 MG tablet Take 25 mg by mouth  daily.  3   lisinopril  (PRINIVIL ,ZESTRIL ) 40 MG tablet Take 40 mg by mouth daily.  3   Multiple Vitamins-Minerals (PRESERVISION AREDS 2 PO) Take by mouth 2 (two) times daily.     lisinopril -hydrochlorothiazide  (PRINZIDE ,ZESTORETIC ) 20-12.5 MG per tablet Take 2 tablets by mouth daily.     potassium chloride  SA (K-DUR,KLOR-CON ) 20 MEQ tablet Take 2 tablets (40 mEq total) by mouth daily. (Patient not taking: Reported on 02/03/2018) 24 tablet 0   No current facility-administered medications for this visit.    ROS:   General:  No weight loss, Fever, chills  HEENT: No recent headaches, no nasal bleeding, no visual changes, no sore throat  Neurologic: No dizziness, blackouts, seizures. No recent symptoms of stroke or mini- stroke. No recent episodes of slurred speech, or temporary blindness.  Cardiac: No recent episodes of chest pain/pressure, no shortness of breath at rest.  No shortness of breath with exertion.  Denies history of atrial fibrillation or irregular heartbeat  Vascular: No history of rest pain in feet.  No history of claudication.  No history of non-healing ulcer, No history of DVT   Pulmonary: No home oxygen, no productive cough, no hemoptysis,  No asthma or  wheezing  Musculoskeletal:  [ ]  Arthritis, [ ]  Low back pain,  [ ]  Joint pain  Hematologic:No history of hypercoagulable state.  No history of easy bleeding.  No history of anemia  Gastrointestinal: No hematochezia or melena,  No gastroesophageal reflux, no trouble swallowing  Urinary: [ ]  chronic Kidney disease, [ ]  on HD - [ ]  MWF or [ ]  TTHS, [ ]  Burning with urination, [ ]  Frequent urination, [ ]  Difficulty urinating;   Skin: No rashes  Psychological: No history of anxiety,  No history of depression   Physical Examination  Vitals:   04/06/24 1321 04/06/24 1322  BP: (!) 157/77 (!) 147/76  Pulse: 64   Resp: 18   Temp: 98.4 F (36.9 C)   TempSrc: Temporal   SpO2: 95%   Weight: 137 lb 6.4 oz (62.3 kg)    Height: 5\' 9"  (1.753 m)     Body mass index is 20.29 kg/m.  General:  Alert and oriented, no acute distress HEENT: Normal Neck: No bruit or JVD Pulmonary: Clear to auscultation bilaterally Cardiac: Regular Rate and Rhythm without murmur Gastrointestinal: Soft, non-tender, non-distended, no mass, no scars Skin: No rash Extremity Pulses:  2+ radial pulses bilaterally Musculoskeletal: No deformity or edema  Neurologic: Upper and lower extremity motor 5/5 and symmetric  DATA:  Right Carotid Findings:  +----------+--------+--------+--------+------------------+--------+           PSV cm/sEDV cm/sStenosisPlaque DescriptionComments  +----------+--------+--------+--------+------------------+--------+  CCA Prox  66      5                                           +----------+--------+--------+--------+------------------+--------+  CCA [id number]      12                                          +----------+--------+--------+--------+------------------+--------+  ICA Prox  65      9       1-39%   heterogenous                +----------+--------+--------+--------+------------------+--------+  ICA Mid   56      13                                          +----------+--------+--------+--------+------------------+--------+  ICA Distal74      19                                          +----------+--------+--------+--------+------------------+--------+  ECA      83      1                                           +----------+--------+--------+--------+------------------+--------+   +----------+--------+-------+----------------+-------------------+           PSV cm/sEDV cmsDescribe        Arm Pressure (mmHG)  +----------+--------+-------+----------------+-------------------+  [id number]    0      Multiphasic, [id number]                  +----------+--------+-------+----------------+-------------------+    +---------+--------+--+--------+--+---------+  VertebralPSV cm/s65EDV cm/s12Antegrade  +---------+--------+--+--------+--+---------+   Left Carotid Findings:  +----------+--------+--------+--------+------------------+--------+           PSV cm/sEDV cm/sStenosisPlaque DescriptionComments  +----------+--------+--------+--------+------------------+--------+  CCA Prox  75      12                                          +----------+--------+--------+--------+------------------+--------+  CCA Distal78      11                                          +----------+--------+--------+--------+------------------+--------+  ICA Prox  61      13      1-39%   heterogenous                +----------+--------+--------+--------+------------------+--------+  ICA Distal88      17                                          +----------+--------+--------+--------+------------------+--------+  ECA      83      7                                           +----------+--------+--------+--------+------------------+--------+   +----------+--------+--------+----------------+-------------------+           PSV cm/sEDV cm/sDescribe        Arm Pressure (mmHG)  +----------+--------+--------+----------------+-------------------+  [id number]    0       Multiphasic, [id number]                  +----------+--------+--------+----------------+-------------------+   +---------+--------+--+--------+-+---------+  VertebralPSV cm/s73EDV cm/s5Antegrade  +---------+--------+--+--------+-+---------+      Summary:  Right Carotid: Velocities in the right ICA are consistent with a 1-39%  stenosis.   Left Carotid: Velocities in the left ICA are consistent with a 1-39%  stenosis.   Vertebrals: Bilateral vertebral arteries demonstrate antegrade flow.  Subclavians: Normal flow hemodynamics were seen in bilateral subclavian               arteries.      ASSESSMENT/PLAN: Several years ago in 2018 she was found to have an asymptomatic carotid bruit in the duplex showed mild stenosis. Over the past 4 years she has had carotid duplex that demonstrates < 39 % stenosis B.  She remains asymptomatic and has < 39% stenosis on repeat duplex today.  I will have her f/u in 2 years for surveillance carotid duplex.  We reviewed signs/symptoms of stroke.  If these occur she will call 911.    Rocky Cipro PA-C  [facility] [place] Office: [phone]  MD in [facility]

## 2024-04-16 DIAGNOSIS — I10 Essential (primary) hypertension: Secondary | ICD-10-CM | POA: Diagnosis not present

## 2024-04-16 DIAGNOSIS — R634 Abnormal weight loss: Secondary | ICD-10-CM | POA: Diagnosis not present

## 2024-04-16 DIAGNOSIS — M8580 Other specified disorders of bone density and structure, unspecified site: Secondary | ICD-10-CM | POA: Diagnosis not present

## 2024-04-16 DIAGNOSIS — R239 Unspecified skin changes: Secondary | ICD-10-CM | POA: Diagnosis not present

## 2024-04-30 DIAGNOSIS — I6529 Occlusion and stenosis of unspecified carotid artery: Secondary | ICD-10-CM | POA: Diagnosis not present

## 2024-04-30 DIAGNOSIS — H3530 Unspecified macular degeneration: Secondary | ICD-10-CM | POA: Diagnosis not present

## 2024-04-30 DIAGNOSIS — R7309 Other abnormal glucose: Secondary | ICD-10-CM | POA: Diagnosis not present

## 2024-04-30 DIAGNOSIS — I10 Essential (primary) hypertension: Secondary | ICD-10-CM | POA: Diagnosis not present

## 2024-04-30 DIAGNOSIS — H9190 Unspecified hearing loss, unspecified ear: Secondary | ICD-10-CM | POA: Diagnosis not present

## 2024-04-30 DIAGNOSIS — Z0000 Encounter for general adult medical examination without abnormal findings: Secondary | ICD-10-CM | POA: Diagnosis not present

## 2024-04-30 DIAGNOSIS — E559 Vitamin D deficiency, unspecified: Secondary | ICD-10-CM | POA: Diagnosis not present

## 2024-04-30 DIAGNOSIS — M8580 Other specified disorders of bone density and structure, unspecified site: Secondary | ICD-10-CM | POA: Diagnosis not present

## 2024-04-30 DIAGNOSIS — E785 Hyperlipidemia, unspecified: Secondary | ICD-10-CM | POA: Diagnosis not present

## 2024-04-30 DIAGNOSIS — R634 Abnormal weight loss: Secondary | ICD-10-CM | POA: Diagnosis not present

## 2024-06-02 ENCOUNTER — Encounter: Payer: Self-pay | Admitting: Physician Assistant

## 2024-06-02 ENCOUNTER — Ambulatory Visit: Admitting: Physician Assistant

## 2024-06-02 VITALS — BP 156/77

## 2024-06-02 DIAGNOSIS — L821 Other seborrheic keratosis: Secondary | ICD-10-CM

## 2024-06-02 DIAGNOSIS — L570 Actinic keratosis: Secondary | ICD-10-CM | POA: Diagnosis not present

## 2024-06-02 DIAGNOSIS — W908XXA Exposure to other nonionizing radiation, initial encounter: Secondary | ICD-10-CM

## 2024-06-02 DIAGNOSIS — D1801 Hemangioma of skin and subcutaneous tissue: Secondary | ICD-10-CM | POA: Diagnosis not present

## 2024-06-02 DIAGNOSIS — L814 Other melanin hyperpigmentation: Secondary | ICD-10-CM | POA: Diagnosis not present

## 2024-06-02 DIAGNOSIS — Z1283 Encounter for screening for malignant neoplasm of skin: Secondary | ICD-10-CM | POA: Diagnosis not present

## 2024-06-02 DIAGNOSIS — L578 Other skin changes due to chronic exposure to nonionizing radiation: Secondary | ICD-10-CM

## 2024-06-02 DIAGNOSIS — D229 Melanocytic nevi, unspecified: Secondary | ICD-10-CM

## 2024-06-02 NOTE — Progress Notes (Signed)
   New Patient Visit   Subjective  Joan Cook is a 88 y.o. female who presents for the following: Skin Cancer Screening and Full Body Skin Exam - No history of skin cancer, no family history of skin cancer. Prior patient of Dr. Livingston here in [place].   The patient presents for Total-Body Skin Exam (TBSE) for skin cancer screening and mole check. The patient has spots, moles and lesions to be evaluated, some may be new or changing and the patient may have concern these could be cancer.    The following portions of the chart were reviewed this encounter and updated as appropriate: medications, allergies, medical history  Review of Systems:  No other skin or systemic complaints except as noted in HPI or Assessment and Plan.  Objective  Well appearing patient in no apparent distress; mood and affect are within normal limits.  A full examination was performed including scalp, head, eyes, ears, nose, lips, neck, chest, axillae, abdomen, back, buttocks, bilateral upper extremities, bilateral lower extremities, hands, feet, fingers, toes, fingernails, and toenails. All findings within normal limits unless otherwise noted below.   Relevant physical exam findings are noted in the Assessment and Plan.  Right cheek, nose (2) Erythematous thin papules/macules with gritty scale.   Assessment & Plan   SKIN CANCER SCREENING PERFORMED TODAY.  ACTINIC DAMAGE - Chronic condition, secondary to cumulative UV/sun exposure - diffuse scaly erythematous macules with underlying dyspigmentation - Recommend daily broad spectrum sunscreen SPF 30+ to sun-exposed areas, reapply every 2 hours as needed.  - Staying in the shade or wearing long sleeves, sun glasses (UVA+UVB protection) and wide brim hats (4-inch brim around the entire circumference of the hat) are also recommended for sun protection.  - Call for new or changing lesions.  LENTIGINES, SEBORRHEIC KERATOSES, CHERRY ANGIOMAS - Benign normal skin  lesions - Benign-appearing - Call for any changes  MELANOCYTIC NEVI - Tan-brown and/or pink-flesh-colored symmetric macules and papules - Benign appearing on exam today - Observation - Call clinic for new or changing moles - Recommend daily use of broad spectrum spf 30+ sunscreen to sun-exposed areas.        AK (ACTINIC KERATOSIS) (2) Right cheek, nose (2) Destruction of lesion - Right cheek, nose (2) Complexity: simple   Destruction method: cryotherapy   Informed consent: discussed and consent obtained   Timeout:  patient name, date of birth, surgical site, and procedure verified Lesion destroyed using liquid nitrogen: Yes   Region frozen until ice ball extended beyond lesion: Yes   Outcome: patient tolerated procedure well with no complications   Post-procedure details: wound care instructions given   SCREENING EXAM FOR SKIN CANCER   ACTINIC SKIN DAMAGE   LENTIGINES   SEBORRHEIC KERATOSIS   CHERRY ANGIOMA   MULTIPLE PIGMENTED NEVI    Return in about 6 months (around 12/02/2024) for AK follow up, 1 year TBSE.  I, Roseline Hutchinson, CMA, am acting as scribe for Rena Hunke K, PA-C .   Documentation: I have reviewed the above documentation for accuracy and completeness, and I agree with the above.  Tashan Kreitzer K, PA-C

## 2024-06-02 NOTE — Patient Instructions (Signed)

## 2024-09-14 DIAGNOSIS — Z961 Presence of intraocular lens: Secondary | ICD-10-CM | POA: Diagnosis not present

## 2024-09-14 DIAGNOSIS — H353124 Nonexudative age-related macular degeneration, left eye, advanced atrophic with subfoveal involvement: Secondary | ICD-10-CM | POA: Diagnosis not present

## 2024-09-14 DIAGNOSIS — H43813 Vitreous degeneration, bilateral: Secondary | ICD-10-CM | POA: Diagnosis not present

## 2024-09-14 DIAGNOSIS — H353113 Nonexudative age-related macular degeneration, right eye, advanced atrophic without subfoveal involvement: Secondary | ICD-10-CM | POA: Diagnosis not present

## 2024-09-14 DIAGNOSIS — H35033 Hypertensive retinopathy, bilateral: Secondary | ICD-10-CM | POA: Diagnosis not present

## 2024-11-24 ENCOUNTER — Ambulatory Visit: Admitting: Physician Assistant

## 2024-11-24 ENCOUNTER — Encounter: Payer: Self-pay | Admitting: Physician Assistant

## 2024-11-24 VITALS — BP 148/83

## 2024-11-24 DIAGNOSIS — Z872 Personal history of diseases of the skin and subcutaneous tissue: Secondary | ICD-10-CM

## 2024-11-24 DIAGNOSIS — L821 Other seborrheic keratosis: Secondary | ICD-10-CM | POA: Diagnosis not present

## 2024-11-24 NOTE — Patient Instructions (Signed)

## 2024-11-24 NOTE — Progress Notes (Signed)
° °  Follow-Up Visit   Subjective  Joan Cook is a 88 y.o. female ESTABLISHED who presents for the following: [place] 6 month follow up of right cheek and nose treated with LN2 x 2 06/02/2024.  She also has a spot on her right lat breast that she would like checked. It has no symptoms.   The following portions of the chart were reviewed this encounter and updated as appropriate: medications, allergies, medical history  Review of Systems:  No other skin or systemic complaints except as noted in HPI or Assessment and Plan.  Objective  Well appearing patient in no apparent distress; mood and affect are within normal limits.   A focused examination was performed of the following areas: Face, right chest   Relevant exam findings are noted in the Assessment and Plan.    Assessment & Plan   HISTORY OF PRECANCEROUS ACTINIC KERATOSIS - site(s) of PreCancerous Actinic Keratosis clear today. - these may recur and new lesions may form requiring treatment to prevent transformation into skin cancer - observe for new or changing spots and contact [facility] for appointment if occur - photoprotection with sun protective clothing; sunglasses and broad spectrum sunscreen with SPF of at least 30 + and frequent self skin exams recommended - yearly exams by a dermatologist recommended for persons with history of PreCancerous Actinic Keratoses  SEBORRHEIC KERATOSIS - Stuck-on, waxy, tan-brown papules and/or plaques  - Benign-appearing - Discussed benign etiology and prognosis. - Observe - Call for any changes   HISTORY OF ACTINIC KERATOSES   SEBORRHEIC KERATOSIS    Return for Follow up as scheduled, [facility].  I, Roseline Hutchinson, CMA, am acting as scribe for [place] Sarinana K, PA-C .   Documentation: I have reviewed the above documentation for accuracy and completeness, and I agree with the above.  Durelle Zepeda K, PA-C

## 2024-12-23 ENCOUNTER — Other Ambulatory Visit: Payer: Self-pay | Admitting: Family Medicine

## 2024-12-23 DIAGNOSIS — Z1231 Encounter for screening mammogram for malignant neoplasm of breast: Secondary | ICD-10-CM

## 2025-02-03 ENCOUNTER — Ambulatory Visit

## 2025-06-06 ENCOUNTER — Ambulatory Visit: Admitting: Physician Assistant
